# Patient Record
Sex: Female | Born: 1975 | Race: Black or African American | Hispanic: No | Marital: Married | Smoking: Never smoker
Health system: Southern US, Community
[De-identification: ages and names within clinical notes are randomized; demographics above are authoritative.]

## PROBLEM LIST (undated history)

## (undated) ENCOUNTER — Inpatient Hospital Stay (HOSPITAL_COMMUNITY): Payer: Self-pay

## (undated) DIAGNOSIS — R51 Headache: Secondary | ICD-10-CM

## (undated) DIAGNOSIS — R112 Nausea with vomiting, unspecified: Secondary | ICD-10-CM

## (undated) DIAGNOSIS — Z8669 Personal history of other diseases of the nervous system and sense organs: Secondary | ICD-10-CM

## (undated) DIAGNOSIS — J302 Other seasonal allergic rhinitis: Secondary | ICD-10-CM

## (undated) DIAGNOSIS — E0590 Thyrotoxicosis, unspecified without thyrotoxic crisis or storm: Secondary | ICD-10-CM

## (undated) DIAGNOSIS — D649 Anemia, unspecified: Secondary | ICD-10-CM

## (undated) DIAGNOSIS — Z9889 Other specified postprocedural states: Secondary | ICD-10-CM

## (undated) DIAGNOSIS — Z8619 Personal history of other infectious and parasitic diseases: Secondary | ICD-10-CM

## (undated) HISTORY — DX: Personal history of other infectious and parasitic diseases: Z86.19

## (undated) HISTORY — PX: WISDOM TOOTH EXTRACTION: SHX21

## (undated) HISTORY — DX: Thyrotoxicosis, unspecified without thyrotoxic crisis or storm: E05.90

## (undated) HISTORY — PX: OTHER SURGICAL HISTORY: SHX169

## (undated) HISTORY — DX: Personal history of other diseases of the nervous system and sense organs: Z86.69

## (undated) HISTORY — PX: MOUTH SURGERY: SHX715

---

## 2006-07-23 ENCOUNTER — Encounter: Payer: Self-pay | Admitting: Family Medicine

## 2006-08-03 ENCOUNTER — Encounter: Payer: Self-pay | Admitting: Family Medicine

## 2006-08-07 ENCOUNTER — Encounter: Payer: Self-pay | Admitting: Family Medicine

## 2006-09-11 ENCOUNTER — Encounter: Payer: Self-pay | Admitting: Family Medicine

## 2007-09-30 ENCOUNTER — Encounter: Payer: Self-pay | Admitting: Family Medicine

## 2010-04-05 ENCOUNTER — Ambulatory Visit
Admission: RE | Admit: 2010-04-05 | Discharge: 2010-04-05 | Payer: Self-pay | Source: Home / Self Care | Attending: Family Medicine | Admitting: Family Medicine

## 2010-04-05 ENCOUNTER — Encounter: Payer: Self-pay | Admitting: Family Medicine

## 2010-04-05 ENCOUNTER — Other Ambulatory Visit: Payer: Self-pay | Admitting: Family Medicine

## 2010-04-05 DIAGNOSIS — Z91018 Allergy to other foods: Secondary | ICD-10-CM | POA: Insufficient documentation

## 2010-04-05 DIAGNOSIS — J309 Allergic rhinitis, unspecified: Secondary | ICD-10-CM | POA: Insufficient documentation

## 2010-04-05 LAB — CBC WITH DIFFERENTIAL/PLATELET
Basophils Absolute: 0 10*3/uL (ref 0.0–0.1)
Basophils Relative: 0.3 % (ref 0.0–3.0)
Eosinophils Absolute: 0.1 10*3/uL (ref 0.0–0.7)
Eosinophils Relative: 2.6 % (ref 0.0–5.0)
HCT: 33.1 % — ABNORMAL LOW (ref 36.0–46.0)
Hemoglobin: 11.4 g/dL — ABNORMAL LOW (ref 12.0–15.0)
Lymphocytes Relative: 43.5 % (ref 12.0–46.0)
Lymphs Abs: 1.4 10*3/uL (ref 0.7–4.0)
MCHC: 34.3 g/dL (ref 30.0–36.0)
MCV: 89.1 fl (ref 78.0–100.0)
Monocytes Absolute: 0.3 10*3/uL (ref 0.1–1.0)
Monocytes Relative: 11.1 % (ref 3.0–12.0)
Neutro Abs: 1.3 10*3/uL — ABNORMAL LOW (ref 1.4–7.7)
Neutrophils Relative %: 42.5 % — ABNORMAL LOW (ref 43.0–77.0)
Platelets: 258 10*3/uL (ref 150.0–400.0)
RBC: 3.72 Mil/uL — ABNORMAL LOW (ref 3.87–5.11)
RDW: 14.4 % (ref 11.5–14.6)
WBC: 3.1 10*3/uL — ABNORMAL LOW (ref 4.5–10.5)

## 2010-04-05 LAB — HEPATIC FUNCTION PANEL
ALT: 14 U/L (ref 0–35)
AST: 21 U/L (ref 0–37)
Albumin: 3.9 g/dL (ref 3.5–5.2)
Alkaline Phosphatase: 45 U/L (ref 39–117)
Bilirubin, Direct: 0.1 mg/dL (ref 0.0–0.3)
Total Bilirubin: 0.7 mg/dL (ref 0.3–1.2)
Total Protein: 7.4 g/dL (ref 6.0–8.3)

## 2010-04-05 LAB — BASIC METABOLIC PANEL
BUN: 11 mg/dL (ref 6–23)
CO2: 29 mEq/L (ref 19–32)
Calcium: 9.2 mg/dL (ref 8.4–10.5)
Chloride: 108 mEq/L (ref 96–112)
Creatinine, Ser: 0.7 mg/dL (ref 0.4–1.2)
GFR: 124.4 mL/min (ref >60.00)
Glucose, Bld: 88 mg/dL (ref 70–99)
Potassium: 4.2 mEq/L (ref 3.5–5.1)
Sodium: 145 mEq/L (ref 135–145)

## 2010-04-05 LAB — LIPID PANEL
Cholesterol: 128 mg/dL (ref 0–200)
HDL: 39.9 mg/dL (ref >39.00)
LDL Cholesterol: 83 mg/dL (ref 0–99)
Total CHOL/HDL Ratio: 3
Triglycerides: 28 mg/dL (ref 0.0–149.0)
VLDL: 5.6 mg/dL (ref 0.0–40.0)

## 2010-04-05 LAB — TSH: TSH: 0.21 u[IU]/mL — ABNORMAL LOW (ref 0.35–5.50)

## 2010-04-05 LAB — T4, FREE: Free T4: 0.95 ng/dL (ref 0.60–1.60)

## 2010-04-27 ENCOUNTER — Encounter: Payer: Self-pay | Admitting: Family Medicine

## 2010-04-28 NOTE — Assessment & Plan Note (Signed)
Summary: NEW PATIENT, EST / LFW   Vital Signs:  Patient profile:   35 year old female Height:      61 inches Weight:      109.75 pounds BMI:     20.81 Temp:     98.6 degrees F oral Pulse rate:   80 / minute Pulse rhythm:   regular BP sitting:   90 / 60  (left arm) Cuff size:   regular  Vitals Entered By: Lewanda Rife LPN (April 05, 2010 11:30 AM) CC: new pt to establish   History of Present Illness: just moved to [place] from [place]  no regular doctor   no doc appts since 2009 in [place]  is generally very healthy   hx of migraines -- improved when wisdom teeth removed and then got worse again  sometimes proceed her peroid -- other times not  does not get them that often -- takes otc med - nsaids and sleeps   thyroid problem- during 2nd pregnancy-- tsh was abn -- was monitoring that  thinks she was hypothyroid  last test in 09 was ok    no other chronic problems   has had 2 cs in past - no other surgeries   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 dust  with move - more symptoms -- congestion and sinus drainge , no wheeze  tried nasal spray otc   ? got Td 2005       Preventive Screening-Counseling & Management  Alcohol-Tobacco     Smoking Status: never  Caffeine-Diet-Exercise     Does Patient Exercise: no      Drug Use:  no.    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  Married Never Smoked Alcohol use-no Drug use-no Regular exercise-no 2 kids 9, 3 - both boys   Risk Factors: Exercise: no (04/05/2010)  Risk Factors: Smoking Status: never (04/05/2010)  Past Medical History: Hx of chicken pox Hx of migraines Throid problem  Past Surgical History: Caesarean section 02/2001 and 07/2006  Family History: Mother living: arthritis Father living: alcoholism/drug problem Maternal Grandfather: arthritis, diabetes Maternal Grandmother:heart disease Maternal Great grandmother: breast cancer no other cancer   Social History: 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  no sinus tenderness  Eyes:  vision grossly intact, pupils equal, pupils round, and pupils reactive to light.  no conjunctival pallor, injection or icterus  Ears:  R ear normal and L ear normal.   Nose:  no nasal discharge.   Mouth:  pharynx pink and moist.   Neck:  prominent thyroid - nt and no thyroid bruits no LN/ no JVD or carotid bruits  Chest Wall:  No deformities, masses, or tenderness noted. Lungs:  Normal respiratory effort, chest expands symmetrically. Lungs are clear to auscultation, no crackles or wheezes. Heart:  Normal rate and regular rhythm. S1 and S2 normal without  gallop, murmur, click, rub or other extra sounds. Abdomen:  Bowel sounds positive,abdomen soft and non-tender without masses, organomegaly or hernias noted. no renal bruits  Msk:  No deformity or scoliosis noted of thoracic or lumbar spine.   Pulses:  R and L carotid,radial,femoral,dorsalis pedis and posterior tibial pulses are full and equal bilaterally Extremities:  No clubbing, cyanosis, edema, or deformity noted with normal full range of motion of all joints.   Neurologic:  sensation intact to light touch, gait normal, and DTRs symmetrical and normal.   Skin:  Intact without suspicious lesions or rashes Cervical Nodes:  No lymphadenopathy noted Inguinal Nodes:  No significant adenopathy Psych:  normal affect, talkative and pleasant    Impression & Recommendations:  Problem # 1:  HYPOTHYROIDISM (ICD-244.9) Assessment New in past with preg now has fatigue check tsh and free T4 prominent thyroid on exam Orders: Venipuncture (16109) TLB-Lipid Panel (80061-LIPID) TLB-BMP (Basic Metabolic Panel-BMET) (80048-METABOL) TLB-CBC Platelet - w/Differential (85025-CBCD) TLB-Hepatic/Liver Function Pnl (80076-HEPATIC) TLB-TSH (Thyroid Stimulating Hormone) (84443-TSH) TLB-T4 (Thyrox), Free (60454-UJ8J)  Problem # 2:  ALLERGIC RHINITIS (ICD-477.9) Assessment: New  trial of flonase for congestion and rhinorrhea  will update    Her updated medication list for this problem includes:    Flonase 50 Mcg/act Susp (Fluticasone propionate) .Marland Kitchen... 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 Otc as directed. 2)  Multivitamins Tabs (Multiple vitamin) .... Take 1 tablet by mouth once a day 3)  Epipen 2-pak 0.3 Mg/0.40ml Devi (Epinephrine) .... Inject times one as needed nut allergy 4)  Flonase 50 Mcg/act Susp (Fluticasone propionate) .... 2 sprays in each nostril once daily  Patient Instructions: 1)  have epi pen on hand for nut reaction  2)  use flonase daily  3)  labs today 4)  schedule PE in next 4-6 weeks please (any 30 min appt)  Prescriptions: FLONASE 50 MCG/ACT SUSP (FLUTICASONE PROPIONATE) 2 sprays in each nostril once daily  #1 mdi x 11   Entered and Authorized by:   Judith Part MD   Signed by:   Judith Part MD on 04/05/2010   Method used:   Electronically to        [id number] (ret[facility])       [address]       [place], [place]  [address]       Ph: [phone]  Fax: [phone]   RxID:   [id number] EPIPEN 2-PAK 0.3 MG/0.3ML DEVI (EPINEPHRINE) inject times one as needed nut allergy  #1 x 11   Entered and Authorized by:   Judith Part MD   Signed by:   Judith Part MD on 04/05/2010   Method used:   Electronically to        [id number] (retail)       [address]       [place], [place]  [address]       Ph: [phone]       Fax: [phone]   RxID:   [id number]    Orders Added: 1)  Venipuncture [55732] 2)  TLB-Lipid Panel [80061-LIPID] 3)  TLB-BMP (Basic Metabolic Panel-BMET) [80048-METABOL] 4)  TLB-CBC Platelet - w/Differential [85025-CBCD] 5)  TLB-Hepatic/Liver Function Pnl [80076-HEPATIC] 6)  TLB-TSH (Thyroid Stimulating Hormone) [84443-TSH] 7)  TLB-T4 (Thyrox), Free [20254-YH0W] 8)  Dermatology Referral [Derma] 9)  Prescription Created Electronically [phone] 10)  New Patient Level III [83151]    Current Allergies (reviewed today): No known allergies

## 2010-04-28 NOTE — Letter (Signed)
Summary: Patient Questionnaire  Patient Questionnaire   Imported By: Beau Fanny 04/06/2010 08:49:50  _____________________________________________________________________  External Attachment:    Type:   Image     Comment:   External Document

## 2010-05-04 NOTE — Op Note (Signed)
Summary: Low Transverse Cesarean Section/[facility]   Low Transverse Cesarean Section/[facility]   Imported By: Maryln Gottron 04/29/2010 15:37:42  _____________________________________________________________________  External Attachment:    Type:   Image     Comment:   External Document

## 2010-05-04 NOTE — Letter (Signed)
[facility]: [facility]   Imported By: Maryln Gottron 04/29/2010 15:30:02  _____________________________________________________________________  External Attachment:    Type:   Image     Comment:   External Document

## 2010-05-04 NOTE — Letter (Signed)
Summary: [facility]   Imported By: Maryln Gottron 04/29/2010 15:28:43  _____________________________________________________________________  External Attachment:    Type:   Image     Comment:   External Document

## 2010-05-04 NOTE — Letter (Signed)
Summary: [facility]  [facility]   Imported By: Maryln Gottron 04/29/2010 15:31:28  _____________________________________________________________________  External Attachment:    Type:   Image     Comment:   External Document

## 2010-05-04 NOTE — Letter (Signed)
Summary: [facility]  [facility]   Imported By: Maryln Gottron 04/29/2010 15:32:57  _____________________________________________________________________  External Attachment:    Type:   Image     Comment:   External Document

## 2010-05-12 NOTE — Letter (Signed)
Summary: [facility] -Endocrinology   [facility] -Endocrinology   Imported By: Kassie Mends 05/03/2010 11:54:17  _____________________________________________________________________  External Attachment:    Type:   Image     Comment:   External Document  Appended Document: [facility] -Endocrinology     Clinical Lists Changes  Observations: Added new observation of PAST MED HX: Hx of chicken pox Hx of migraines subclinical hyperthyroidism      endo- [facility] (05/05/2010 21:40)       Past Medical History:    Hx of chicken pox    Hx of migraines    subclinical hyperthyroidism                     [facility]

## 2010-05-16 ENCOUNTER — Other Ambulatory Visit (HOSPITAL_COMMUNITY)
Admission: RE | Admit: 2010-05-16 | Discharge: 2010-05-16 | Disposition: A | Discharge status: Home / Self Care | Payer: BC Managed Care – PPO | Source: Ambulatory Visit | Attending: Family Medicine | Admitting: Family Medicine

## 2010-05-16 ENCOUNTER — Other Ambulatory Visit: Payer: Self-pay | Admitting: Family Medicine

## 2010-05-16 ENCOUNTER — Encounter: Payer: Self-pay | Admitting: Family Medicine

## 2010-05-16 ENCOUNTER — Encounter (INDEPENDENT_AMBULATORY_CARE_PROVIDER_SITE_OTHER): Payer: BC Managed Care – PPO | Admitting: Family Medicine

## 2010-05-16 DIAGNOSIS — E0590 Thyrotoxicosis, unspecified without thyrotoxic crisis or storm: Secondary | ICD-10-CM

## 2010-05-16 DIAGNOSIS — Z01419 Encounter for gynecological examination (general) (routine) without abnormal findings: Secondary | ICD-10-CM

## 2010-05-16 DIAGNOSIS — R87810 Cervical high risk human papillomavirus (HPV) DNA test positive: Secondary | ICD-10-CM | POA: Insufficient documentation

## 2010-05-16 DIAGNOSIS — Z0000 Encounter for general adult medical examination without abnormal findings: Secondary | ICD-10-CM

## 2010-05-16 DIAGNOSIS — D649 Anemia, unspecified: Secondary | ICD-10-CM

## 2010-05-16 DIAGNOSIS — Z124 Encounter for screening for malignant neoplasm of cervix: Secondary | ICD-10-CM | POA: Insufficient documentation

## 2010-05-17 ENCOUNTER — Ambulatory Visit: Payer: Self-pay

## 2010-05-24 ENCOUNTER — Telehealth: Payer: Self-pay | Admitting: Family Medicine

## 2010-05-24 DIAGNOSIS — R87810 Cervical high risk human papillomavirus (HPV) DNA test positive: Secondary | ICD-10-CM | POA: Insufficient documentation

## 2010-05-24 NOTE — Assessment & Plan Note (Signed)
Summary: 4-6 WEEK PE 30 MIN/[facility]H   Vital Signs:  Patient profile:   35 year old female Weight:      107.25 pounds BMI:     20.34 Temp:     98.1 degrees F oral Pulse rate:   64 / minute Pulse rhythm:   regular BP sitting:   98 / 58  (left arm) Cuff size:   regular  Vitals Entered By: Sydell Axon LPN (May 16, 2010 9:24 AM) CC: 30 Minute checkup   History of Present Illness: here for wellness is feeling fine -- feeling a little more energetic  started exercising   wt is down 2 lb  bp 98/58  tsh was low- ref to endo ruled out grave's dz  gets uptake and scan tomorrow     lipids good Last Lipid ProfileCholesterol: 128 (04/05/2010 12:03:23 PM)HDL:  39.90 (04/05/2010 12:03:23 PM)LDL:  83 (04/05/2010 12:03:23 PM)Triglycerides:  Last Liver profileSGOT:  21 (04/05/2010 12:03:23 PM)SPGT:  14 (04/05/2010 12:03:23 PM)T. Bili:  0.7 (04/05/2010 12:03:23 PM)Alk Phos:  45 (04/05/2010 12:03:23 PM)    a little anemic with 11.4 hb has always been anemic just a little bit  peroids are medium flow - lasting about 5 days or so  no [place] trait / or thalassemia know  very balanced diet most day     pap-- needs to do that today  no abn paps -  no problems   td -- 2006 she thinks  thinks the flonase is helping some  Allergies: No Known Drug Allergies  Past History:  Past Medical History: Last updated: 05/05/2010 Hx of chicken pox Hx of migraines subclinical hyperthyroidism      [facility]  Past Surgical History: Last updated: 04/05/2010 Caesarean section 02/2001 and 07/2006  Family History: Last updated: 04/05/2010 Mother living: arthritis Father living: alcoholism/drug problem Maternal Grandfather: arthritis, diabetes Maternal Grandmother:heart disease Maternal Great grandmother: breast cancer no other cancer   Social History: Last updated: 04/05/2010 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 Eyes:  Denies blurring and eye irritation. ENT:  Complains of nasal congestion; denies sinus pressure. CV:  Denies chest pain or discomfort, lightheadness, palpitations, and shortness of breath with exertion. Resp:  Denies cough, shortness of breath, and wheezing. GI:  Denies abdominal pain, change in bowel habits, indigestion, and nausea. GU:  Denies dysuria and urinary frequency. MS:  Denies joint pain, joint redness, joint swelling, and cramps. Derm:  Denies itching, lesion(s), poor wound healing, and rash. Neuro:  Denies numbness and tingling. Psych:  Denies anxiety and depression. Endo:  Denies cold intolerance, excessive thirst, excessive urination, and heat intolerance. Heme:  Denies abnormal bruising and bleeding.  Physical Exam  General:  Well-developed,well-nourished,in no acute distress; alert,appropriate and cooperative throughout examination Head:  normocephalic, atraumatic, and no abnormalities observed.   Eyes:  vision grossly intact, pupils equal, pupils round, and pupils reactive to light.  no conjunctival pallor, injection or icterus  Ears:  R ear normal and L ear normal.   Nose:  no nasal discharge.  - nares are injected Mouth:  pharynx pink and moist.   Neck:  prominent thyroid - nt and no thyroid  bruits no LN/ no JVD or carotid bruits  Chest Wall:  No deformities, masses, or tenderness noted. Breasts:  No mass, nodules, thickening, tenderness, bulging, retraction, inflamation, nipple discharge or skin changes noted.   Lungs:  Normal respiratory effort, chest expands symmetrically. Lungs are clear to auscultation, no crackles or wheezes. Heart:  Normal rate and  regular rhythm. S1 and S2 normal without gallop, murmur, click, rub or other extra sounds. Abdomen:  Bowel sounds positive,abdomen soft and non-tender without masses, organomegaly or hernias noted. no renal bruits  Genitalia:  Normal introitus for age, no external lesions, no vaginal discharge, mucosa pink and moist, no vaginal or cervical lesions, no vaginal atrophy, no friaility or hemorrhage, normal uterus size and position, no adnexal masses or tenderness Msk:  No deformity or scoliosis noted of thoracic or lumbar spine.   Pulses:  R and L carotid,radial,femoral,dorsalis pedis and posterior tibial pulses are full and equal bilaterally Extremities:  No clubbing, cyanosis, edema, or deformity noted with normal full range of motion of all joints.   Neurologic:  sensation intact to light touch, gait normal, and DTRs symmetrical and normal.   Skin:  Intact without suspicious lesions or rashes Cervical Nodes:  No lymphadenopathy noted Axillary Nodes:  No palpable lymphadenopathy Inguinal Nodes:  No significant adenopathy Psych:  normal affect, talkative and pleasant    Impression & Recommendations:  Problem # 1:  HEALTH MAINTENANCE EXAM (ICD-V70.0) Assessment Comment Only reviewed health habits including diet, exercise and skin cancer prevention reviewed health maintenance list and family history rev labs with pt  good cholesterol   Problem # 2:  HYPERTHYROIDISM, SUBCLINICAL (ICD-242.90) Assessment: New will follow with endo for scan and uptake   Problem # 3:  ANEMIA, 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 Otc as  directed. 2)  Multivitamins Tabs (Multiple vitamin) .... Take 1 tablet by mouth once a day 3)  Epipen 2-pak 0.3 Mg/0.84ml Devi (Epinephrine) .... Inject times one as needed nut allergy 4)  Flonase 50 Mcg/act Susp (Fluticasone propionate) .... 2 sprays in each nostril once daily  Patient Instructions: 1)  try to take a multivitamin with iron every day for mild anemia  2)  if your insurance covers a baseline screening mammogram at 34- let me know and I will schedule it    Orders Added: 1)  Est. Patient 18-39 years [99395]    Current Allergies (reviewed today): No known allergies    Preventive Care Screening  Last Tetanus Booster:    Date:  03/27/2004    Results:  Td

## 2010-05-27 ENCOUNTER — Encounter: Payer: Self-pay | Admitting: Family Medicine

## 2010-06-02 NOTE — Progress Notes (Signed)
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  I tried to reassure her that hpv is easily controlled with pap smears, most women will have it at one time or another and that most women clear the virus, but she wanted me to check with you.               Lowella Petties CMA, AAMA  May 24, 2010 3:13 PM   Follow-up for Phone Call        it is a virus that can increase the risk of cervical changes  most people clear the virus themselves so the recommendation is to continue yearly paps and hpv tests  if a pap came back abnormal we would refer to a gyn for a cervical biopsy  the hpv is found via a lab test  if she feels uncomfortable with this and would like to see a gyn I am happy to refer at any time   Follow-up by: Judith Part MD,  May 24, 2010 3:44 PM  Additional Follow-up for Phone Call Additional follow up Details #1::        Patient notified as instructed by telephone. Pt would like to go ahead and have GYN referral now Pt said she wants a GYN that is a good educator and is not in a hurry (someone like Dr. Milinda Antis pt said).Pt can [facility] to [facility] or [facility] for appt and will wait to hear from pt care coordinator about appt. Pt can be reached at [phone] or [phone].Lewanda Rife LPN  May 24, 2010 5:37 PM   New Problems: CERV HIGH RISK HUMAN PAPILLOMAVIRUS DNA TEST POS (ICD-795.05)   Additional Follow-up for Phone Call Additional follow up Details #2::    I will go ahead and do referral to GYN I like [facility] but that would depend on insurance   Follow-up by: Judith Part MD,  May 24, 2010 5:51 PM  Additional Follow-up for Phone Call Additional follow up Details #3::  Details for Additional Follow-up Action Taken: Appt made with Dr Vincente Poli on 06/15/2010 at 9am. Additional Follow-up by: Carlton Adam,  May 25, 2010 9:52 AM  New Problems: CERV HIGH RISK HUMAN PAPILLOMAVIRUS DNA TEST POS (ICD-795.05)

## 2010-06-14 NOTE — Letter (Signed)
Summary: [facility]  [facility]   Imported By: Maryln Gottron 06/02/2010 13:55:56  _____________________________________________________________________  External Attachment:    Type:   Image     Comment:   External Document

## 2010-06-23 ENCOUNTER — Encounter: Payer: Self-pay | Admitting: Family Medicine

## 2010-10-11 ENCOUNTER — Telehealth: Payer: Self-pay | Admitting: *Deleted

## 2010-10-11 NOTE — Telephone Encounter (Signed)
Patient dropped off cpx form to be filled out. Form is on your shelf. If patient needs an appt before this can be filled out, please send this back to me and I will call her to schedule it. Her cpx was done on 05-18-10.

## 2010-10-11 NOTE — Telephone Encounter (Signed)
This form requires vision and hearing screen (gross) and ua  These were not done at her last visit She needs to come in for a visit - will be quick just to do those things and I will talk to her briefly  Thanks  Will put form back in IN box

## 2010-10-12 NOTE — Telephone Encounter (Signed)
Left message for patient to call and schedule appt

## 2010-10-12 NOTE — Telephone Encounter (Signed)
Patient called back, scheduled appt for Monday.

## 2010-10-18 ENCOUNTER — Encounter: Payer: Self-pay | Admitting: Family Medicine

## 2010-10-19 ENCOUNTER — Ambulatory Visit (INDEPENDENT_AMBULATORY_CARE_PROVIDER_SITE_OTHER): Payer: BC Managed Care – PPO | Admitting: Family Medicine

## 2010-10-19 ENCOUNTER — Encounter: Payer: Self-pay | Admitting: Family Medicine

## 2010-10-19 VITALS — BP 88/60 | HR 76 | Temp 98.7°F | Ht 60.75 in | Wt 112.8 lb

## 2010-10-19 DIAGNOSIS — Z020 Encounter for examination for admission to educational institution: Secondary | ICD-10-CM | POA: Insufficient documentation

## 2010-10-19 DIAGNOSIS — Z0289 Encounter for other administrative examinations: Secondary | ICD-10-CM

## 2010-10-19 DIAGNOSIS — Z0000 Encounter for general adult medical examination without abnormal findings: Secondary | ICD-10-CM

## 2010-10-19 NOTE — Progress Notes (Signed)
Subjective:    Patient ID: Abigail Kemp, female    DOB: 06/21/1975, 35 y.o.   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 ALLERGIC RHINITIS  . PERSONAL HISTORY OF ALLERGY TO OTHER FOODS  . HYPERTHYROIDISM, SUBCLINICAL  . ANEMIA, MILD  . CERV HIGH RISK HUMAN PAPILLOMAVIRUS DNA TEST POS  . School health examination   Past Medical History  Diagnosis Date  . History of chicken pox   . Hx of migraines   . Subclinical hyperthyroidism    Past Surgical History  Procedure Date  . Caesarean section  02-2001 and 07-2006   History  Substance Use Topics  . Smoking status: Never Smoker   . Smokeless tobacco: Not on file  . Alcohol Use: No   Family History  Problem Relation Age of Onset  . Arthritis Mother   . Alcohol abuse Father   . Drug abuse Father   . Heart disease Maternal Grandmother   . Arthritis Maternal Grandfather   . Diabetes Maternal Grandfather   . Cancer Neg Hx    No Known Allergies Current Outpatient Prescriptions on File Prior to Visit  Medication Sig Dispense Refill  . fluticasone (FLONASE) 50 MCG/ACT nasal spray Place 2 sprays into the nose daily.        . multivitamin (THERAGRAN) per tablet Take 1 tablet by mouth daily.        Marland Kitchen acetaminophen (TYLENOL) 500 MG tablet Take 500 mg by mouth as directed.        Marland Kitchen EPINEPHrine (EPIPEN 2-PAK) 0.3 mg/0.3 mL DEVI Inject 0.3 mg into the muscle once. Inject times one as needed for nut allergy          Review of Systems Review of Systems  Constitutional: Negative for fever, appetite change, fatigue and unexpected weight change.  Eyes:  Negative for pain and visual disturbance.  Respiratory: Negative for cough and shortness of breath.   Cardiovascular: Negative.  for cp or palpitations Gastrointestinal: Negative for nausea, diarrhea and constipation.  Genitourinary: Negative for urgency and frequency.  Skin: Negative for pallor. or rash Neurological: Negative for weakness, light-headedness, numbness and headaches.  Hematological: Negative for adenopathy. Does not bruise/bleed easily.  Psychiatric/Behavioral: Negative for dysphoric mood. The patient is not nervous/anxious.          Objective:   Physical Exam  Constitutional: She appears well-developed and well-nourished. No distress.  HENT:  Head: Normocephalic and atraumatic.  Right Ear: External ear normal.  Left Ear: External ear normal.  Nose: Nose normal.  Mouth/Throat: Oropharynx is clear and moist.  Eyes: Conjunctivae and EOM are normal. Pupils are equal, round, and reactive to light.  Neck: Normal range of motion. Neck supple. No JVD present. Carotid bruit is not present. No thyromegaly present.  Cardiovascular: Normal rate, regular rhythm, normal heart sounds and intact distal pulses.   Pulmonary/Chest: Effort normal and breath sounds normal. No respiratory distress. She has no wheezes.  Abdominal: Soft. Bowel sounds are normal. She exhibits no distension and no mass. There is no tenderness.  Musculoskeletal:  Normal range of motion. She exhibits no edema and no tenderness.  Lymphadenopathy:    She has no cervical adenopathy.  Neurological: She has normal reflexes. No cranial nerve deficit. Coordination normal.  Skin: Skin is warm and dry. No rash noted. No erythema. No pallor.  Psychiatric: She has a normal mood and affect.          Assessment & Plan:

## 2010-10-19 NOTE — Patient Instructions (Signed)
Please get your immunization records and bring them back/ drop them off with your form  No restrictions for school Good luck with everything  Please have the ladies copy your form on the way out

## 2010-10-19 NOTE — Assessment & Plan Note (Signed)
No restrictions for beginning doctorate program at [employer] Since not in health sciences does not need PPD Pt signed waiver to skip meningococcal vaccine Td up to date Need rest of imms Exam form filled out  Pt will bring imms and then will finish forms

## 2011-05-22 ENCOUNTER — Ambulatory Visit (INDEPENDENT_AMBULATORY_CARE_PROVIDER_SITE_OTHER): Payer: BC Managed Care – PPO | Admitting: Family Medicine

## 2011-05-22 ENCOUNTER — Encounter: Payer: Self-pay | Admitting: Family Medicine

## 2011-05-22 VITALS — BP 100/60 | HR 88 | Temp 98.6°F | Ht 60.75 in | Wt 116.5 lb

## 2011-05-22 DIAGNOSIS — Z331 Pregnant state, incidental: Secondary | ICD-10-CM

## 2011-05-22 DIAGNOSIS — K429 Umbilical hernia without obstruction or gangrene: Secondary | ICD-10-CM

## 2011-05-22 DIAGNOSIS — Z3201 Encounter for pregnancy test, result positive: Secondary | ICD-10-CM

## 2011-05-22 NOTE — Assessment & Plan Note (Signed)
Small - for the most part asymptomatic ? What will need to be done for pregnancy Pt will have her gyn eval this  Adv if pain or problems to update

## 2011-05-22 NOTE — Patient Instructions (Addendum)
Check with pharmacy about chewable prenatal vitamin or see if there is one over the counter  If not, just ask about the best brand and call and I will px it Glad you are feeling good  Keep well hydrated  We will refer you to OBGYN at check out (for pregnancy and also ask her about hernia)

## 2011-05-22 NOTE — Progress Notes (Signed)
Subjective:    Patient ID: Abigail Kemp, female    DOB: 10/08/1975, 36 y.o.   MRN: [id number]  HPI Here with new pregnancy -- LMP was 5th of January- and was fairly light and short  Is feeling fair- very tired and bad taste in her mouth all the time She goes to Dr Vincente Poli -- has not called them to make an appt yet   Has 2 children already- they are 10, 41/2   A little nausea - no vomiting , but has dry heaves  Has not started PNV yet  Will get otc  Is drinking enough water  Sticking with bland diet- and craving spinach   Has a hernia that is bothering her  Before the holidays -- had a little pain occasionally Is sticking out - umbilical  Did bother her a bit during 2nd pregnancy    No other symptoms   Patient Active Problem List  Diagnoses  . ALLERGIC RHINITIS  . PERSONAL HISTORY OF ALLERGY TO OTHER FOODS  . HYPERTHYROIDISM, SUBCLINICAL  . ANEMIA, MILD  . CERV HIGH RISK HUMAN PAPILLOMAVIRUS DNA TEST POS  . School health examination  . Pregnancy examination or test, positive result  . Hernia, umbilical   Past Medical History  Diagnosis Date  . History of chicken pox   . Hx of migraines   . Subclinical hyperthyroidism    Past Surgical History  Procedure Date  . Caesarean section  02-2001 and 07-2006   History  Substance Use Topics  . Smoking status: Never Smoker   . Smokeless tobacco: Not on file  . Alcohol Use: No   Family History  Problem Relation Age of Onset  . Arthritis Mother   . Alcohol abuse Father   . Drug abuse Father   . Heart disease Maternal Grandmother   . Arthritis Maternal Grandfather   . Diabetes Maternal Grandfather   . Cancer Neg Hx    No Known Allergies Current Outpatient Prescriptions on File Prior to Visit  Medication Sig Dispense Refill  . acetaminophen (TYLENOL) 500 MG tablet Take 500 mg by mouth as directed.        Marland Kitchen EPINEPHrine (EPIPEN 2-PAK) 0.3 mg/0.3 mL DEVI Inject 0.3 mg into the muscle once. Inject times one as needed  for nut allergy           Review of Systems Review of Systems  Constitutional: Negative for fever, appetite change, and unexpected weight change.  Eyes: Negative for pain and visual disturbance.  Respiratory: Negative for cough and shortness of breath.   Cardiovascular: Negative for cp or palpitations    Gastrointestinal: Negative for diarrhea and constipation. neg for vomiting  Genitourinary: Negative for urgency and frequency.  Skin: Negative for pallor or rash   Neurological: Negative for weakness, light-headedness, numbness and headaches.  Hematological: Negative for adenopathy. Does not bruise/bleed easily.  Psychiatric/Behavioral: Negative for dysphoric mood. The patient is not nervous/anxious.          Objective:   Physical Exam  Constitutional: She appears well-developed and well-nourished. No distress.  HENT:  Head: Normocephalic and atraumatic.  Mouth/Throat: Oropharynx is clear and moist.  Eyes: Conjunctivae and EOM are normal. Pupils are equal, round, and reactive to light. No scleral icterus.  Neck: Normal range of motion. Neck supple. No JVD present. No thyromegaly present.  Cardiovascular: Normal rate, regular rhythm, normal heart sounds and intact distal pulses.  Exam reveals no gallop.   Pulmonary/Chest: Effort normal and breath sounds normal. No respiratory distress. She  has no wheezes.  Abdominal: Soft. Bowel sounds are normal. She exhibits no distension and no mass. There is no tenderness.       No suprapubic tenderness    Small 1-2 cm umbilical hernia noted that is reducible and nontender   Musculoskeletal: She exhibits no edema.  Lymphadenopathy:    She has no cervical adenopathy.  Neurological: She is alert. She has normal reflexes. She exhibits normal muscle tone. Coordination normal.  Skin: Skin is warm and dry. No rash noted. No erythema. No pallor.  Psychiatric: She has a normal mood and affect.          Assessment & Plan:

## 2011-05-22 NOTE — Assessment & Plan Note (Signed)
With mild nausea- feeling ok  Will call if she needs px for chewable PNV Disc avoidance of alcohol / otc meds Will stop flonase  Ref to OBGYN Per LMP - is 7 2/7 weeks - but last menses was light - may need [place] for dates

## 2011-05-23 ENCOUNTER — Ambulatory Visit: Payer: BC Managed Care – PPO | Admitting: Family Medicine

## 2011-06-05 LAB — OB RESULTS CONSOLE HIV ANTIBODY (ROUTINE TESTING): HIV: NONREACTIVE

## 2011-06-05 LAB — OB RESULTS CONSOLE RPR: RPR: NONREACTIVE

## 2011-06-05 LAB — OB RESULTS CONSOLE ABO/RH

## 2011-06-05 LAB — OB RESULTS CONSOLE ANTIBODY SCREEN: Antibody Screen: NEGATIVE

## 2011-10-17 ENCOUNTER — Other Ambulatory Visit: Payer: Self-pay | Admitting: Family Medicine

## 2011-10-17 NOTE — Telephone Encounter (Signed)
Will refill electronically  

## 2011-10-17 NOTE — Telephone Encounter (Signed)
Received refill request electronically from pharmacy. Medication is no longer on med sheet. Is it okay to refill medication? 

## 2011-12-21 ENCOUNTER — Encounter (HOSPITAL_COMMUNITY): Payer: Self-pay | Admitting: Pharmacist

## 2011-12-26 ENCOUNTER — Encounter (HOSPITAL_COMMUNITY): Payer: Self-pay | Admitting: *Deleted

## 2011-12-26 ENCOUNTER — Inpatient Hospital Stay (HOSPITAL_COMMUNITY): Payer: BC Managed Care – PPO

## 2011-12-26 ENCOUNTER — Inpatient Hospital Stay (HOSPITAL_COMMUNITY)
Admission: AD | Admit: 2011-12-26 | Discharge: 2011-12-26 | Disposition: A | Discharge status: Home / Self Care | Payer: BC Managed Care – PPO | Source: Ambulatory Visit | Attending: Obstetrics and Gynecology | Admitting: Obstetrics and Gynecology

## 2011-12-26 DIAGNOSIS — O99891 Other specified diseases and conditions complicating pregnancy: Secondary | ICD-10-CM | POA: Insufficient documentation

## 2011-12-26 DIAGNOSIS — O479 False labor, unspecified: Secondary | ICD-10-CM

## 2011-12-26 DIAGNOSIS — O26859 Spotting complicating pregnancy, unspecified trimester: Secondary | ICD-10-CM | POA: Insufficient documentation

## 2011-12-26 NOTE — Progress Notes (Signed)
NP did spec exam and no pooling or evidence of SROM.

## 2011-12-26 NOTE — MAU Note (Signed)
PT SAYS SHE IS [facility] C/S - REPEAT- ON 01-08-2012.  NOTICED BLOODY SHOW AT 5 PM.    FEELS SOME UC.   UNSURE IF SROM- CALLED ON CALL NURSE.  HAD APPOINTMENT  TODAY-  VE - CLOSED.  DENIES HSV AND MRSA.

## 2011-12-26 NOTE — MAU Provider Note (Signed)
History     CSN: [id number]  Arrival date and time: 12/26/11 1919   None     No chief complaint on file.  HPI Abigail Kemp 36 y.o. [redacted]w[redacted]d Comes to MAU with spotting and thinking her water has broken.  Previous C/S with scheduled date of 01-08-12.  Was in the office today and had cervical exam.  Has been seeing blood and mucus when wiping.  With a previous pregnancy has only a small leak of fluid with ROM so is suspecting ROM today.  Also has not ever had bleeding before so is concerned.  Having contractions but not having pain.  OB History    Grav Para Term Preterm Abortions TAB SAB Ect Mult Living   4 3 3       2       Past Medical History  Diagnosis Date  . History of chicken pox   . Hx of migraines   . Subclinical hyperthyroidism     Past Surgical History  Procedure Date  . Caesarean section  02-2001 and 07-2006  . Cesarean section   . Mouth surgery     wisdom teeth    Family History  Problem Relation Age of Onset  . Arthritis Mother   . Alcohol abuse Father   . Drug abuse Father   . Heart disease Maternal Grandmother   . Arthritis Maternal Grandfather   . Diabetes Maternal Grandfather   . Cancer Neg Hx     History  Substance Use Topics  . Smoking status: Never Smoker   . Smokeless tobacco: Not on file  . Alcohol Use: No    Allergies:  Allergies  Allergen Reactions  . Other Anaphylaxis    All nuts except peanuts    Prescriptions prior to admission  Medication Sig Dispense Refill  . Fe Cbn-Fe Gluc-FA-B12-C-DSS (FERRALET 90) 90-1 MG TABS Take 1 tablet by mouth daily.      . fluticasone (FLONASE) 50 MCG/ACT nasal spray Place 2 sprays into the nose daily as needed. For allergies      . methimazole (TAPAZOLE) 5 MG tablet Take 5 mg by mouth daily.      . Prenatal Vit-Fe Fumarate-FA (MULTIVITAMIN-PRENATAL) 27-0.8 MG TABS Take 1 tablet by mouth daily.      Marland Kitchen EPINEPHrine (EPIPEN 2-PAK) 0.3 mg/0.3 mL DEVI Inject 0.3 mg into the muscle once. Inject times  one as needed for nut allergy         Review of Systems  Gastrointestinal: Negative for abdominal pain.  Genitourinary: Negative for dysuria.       Vagina bleeding   Physical Exam   Blood pressure 117/61, pulse 86, temperature 98.1 F (36.7 C), temperature source Oral, resp. rate 20, height 5' 0.5" (1.537 m), weight 71.782 kg (158 lb 4 oz), last menstrual period 04/01/2011.  Physical Exam  Nursing note and vitals reviewed. Constitutional: She is oriented to person, place, and time. She appears well-developed and well-nourished.  HENT:  Head: Normocephalic.  Eyes: EOM are normal.  Neck: Neck supple.  GI: Soft. There is no tenderness.       Fetal monitor shows contractions 3-6 minutes, but client is in no discomfort.  Client sitting in bed - had decel lasting 3 minutes with contraction.  Also had 2 more decels.  Position change to left lateral side - no further decels noted.  Will get BPP and AFI.  Genitourinary:       Speculum exam - no pooling with valsalva, minimal discharge, dark blood with  mucus seen coming from the cervical os - small amount.  Musculoskeletal: Normal range of motion.  Neurological: She is alert and oriented to person, place, and time.  Skin: Skin is warm and dry.  Psychiatric: She has a normal mood and affect.    MAU Course  Procedures Clinical Data: Vaginal spotting  LIMITED OBSTETRIC ULTRASOUND  Number of Fetuses: 1  Heart Rate: 140 bpm  Movement: Identified  Presentation: Cephalic  Placental Location: Fundal  Previa: Not identified  Amniotic Fluid (Subjective): Within normal limits  AFI: 18.6 cm (5%ile 7.3 cm, 95%ile 23.9 cm)  BPD: 10cm 41w 2d  MATERNAL FINDINGS:  Cervix: Not evaluate  Uterus/Adnexae: Within normal limits. Ovaries not visualized.  Biophysical profile: Time elapsed 5 minutes. Total score 8/8, with  normal movement, breathing, tone, and amniotic fluid.  IMPRESSION:  Biophysical profile 8/8.  No placental previa  identified.  MDM Likely decels seen were related to maternal position.  Decels did not continue with position change.  BPP 8/8 Monitor strip FHT baseline 150.  Moderate variability  - reactive  Assessment and Plan  [redacted]w[redacted]d pregnancy, previous C/S, evaluated for ROM but no ROM found  Plan Will discharge Follow up with appointments as scheduled in the office. Call your doctor if the bleeding worsens.    Abigail Kemp 12/26/2011, 9:36 PM

## 2011-12-26 NOTE — MAU Provider Note (Deleted)
Shandee Jergens 36 y.o. [redacted]w[redacted]d Previous C/S and scheduled for C/S on 01-08-12.  Was in the office earlier today and had vaginal exam.  Comes to MAU with spotting and questioning whether membranes have  Ruptured as she is seeing blood and mucus when she wipes.  Speculum exam to R/O ROM.  Vagina - no pooling.  Cervix has very small amount of mucus and dark blood coming from os.  No leaking with valsalva.  Minimal discharge noted.  Bimanual exam - cervix closed, thick and soft.  Pink blood noted on glove.  Clinically  - no ROM.   Fetal monitor - baseline 150.  Contractions every 3-6 minutes - not painful.  Decel to 120 noted with contraction lasting 3 minutes.  Will get BPP and AFI to confirm appropriate fluid and fetal wellbeing given bloody discharge and client perception of possible ROM.  States she has never had bleeding previously with any pregnancy.

## 2011-12-26 NOTE — Progress Notes (Signed)
Lilyan Punt NP in earlier and u/s results discussed along with d/c plan. Written and verbal d/c instructions given and understanding voiced.

## 2011-12-29 ENCOUNTER — Encounter (HOSPITAL_COMMUNITY): Payer: Self-pay

## 2012-01-01 ENCOUNTER — Encounter (HOSPITAL_COMMUNITY): Payer: Self-pay

## 2012-01-01 ENCOUNTER — Encounter (HOSPITAL_COMMUNITY)
Admission: RE | Admit: 2012-01-01 | Discharge: 2012-01-01 | Disposition: A | Discharge status: Home / Self Care | Payer: BC Managed Care – PPO | Source: Ambulatory Visit | Attending: Obstetrics and Gynecology | Admitting: Obstetrics and Gynecology

## 2012-01-01 HISTORY — DX: Anemia, unspecified: D64.9

## 2012-01-01 HISTORY — DX: Headache: R51

## 2012-01-01 HISTORY — DX: Other seasonal allergic rhinitis: J30.2

## 2012-01-01 HISTORY — DX: Other specified postprocedural states: Z98.890

## 2012-01-01 HISTORY — DX: Nausea with vomiting, unspecified: R11.2

## 2012-01-01 LAB — ABO/RH: ABO/RH(D): O POS

## 2012-01-01 LAB — TYPE AND SCREEN
ABO/RH(D): O POS
Antibody Screen: NEGATIVE

## 2012-01-01 LAB — CBC
HCT: 34.2 % — ABNORMAL LOW (ref 36.0–46.0)
Hemoglobin: 11.5 g/dL — ABNORMAL LOW (ref 12.0–15.0)
MCH: 30.5 pg (ref 26.0–34.0)
MCHC: 33.6 g/dL (ref 30.0–36.0)
MCV: 90.7 fL (ref 78.0–100.0)
Platelets: 185 10*3/uL (ref 150–400)
RBC: 3.77 MIL/uL — ABNORMAL LOW (ref 3.87–5.11)
RDW: 14.1 % (ref 11.5–15.5)
WBC: 9.2 10*3/uL (ref 4.0–10.5)

## 2012-01-01 LAB — SURGICAL PCR SCREEN
MRSA, PCR: NEGATIVE
Staphylococcus aureus: NEGATIVE

## 2012-01-01 LAB — SYPHILIS: RPR W/REFLEX TO RPR TITER AND TREPONEMAL ANTIBODIES, TRADITIONAL SCREENING AND DIAGNOSIS ALGORITHM: RPR Ser Ql: NONREACTIVE

## 2012-01-01 NOTE — Patient Instructions (Addendum)
   Your procedure is scheduled on: Monday, Oct 14  Enter through the Main Entrance of [facility] at: 6 am Pick up the phone at the desk and dial [phone] and inform us of your arrival.  Please call this number if you have any problems the morning of surgery: [phone]  Remember: Do not eat food after midnight: Sunday Do not drink clear liquids after: Sunday Take these medicines the morning of surgery with a SIP OF WATER: methimazole  Do not wear jewelry, make-up, or FINGER nail polish No metal in your hair or on your body. Do not wear lotions, powders, perfumes. You may wear deodorant.  Please use your CHG wash as directed prior to surgery.  Do not shave anywhere for at least 12 hours prior to first CHG shower.  Do not bring valuables to the hospital. Contacts, dentures or bridgework may not be worn into surgery.  Leave suitcase in the car. After Surgery it may be brought to your room. For patients being admitted to the hospital, checkout time is 11:00am the day of discharge.  Home with husband Homero Fellers cell [phone].  Patients discharged on the day of surgery will not be allowed to drive home.

## 2012-01-09 NOTE — H&P (Signed)
36 year old G 3 P 2 presents for Repeat LTCS.  She has had 2 previous C sections.  PNC see hollister GBBS negative history of hyperthyroidism  Afebrile Vital signs stable General alert and oriented Lung CTAB Car RRR Abdomen is soft and nontender  Impression IUP at term Previous C Section x 2  Plan Repeat LTCS  Risks reviewed with patient

## 2012-01-09 NOTE — Pre-Procedure Instructions (Signed)
Pt notified of date and time change of her surgery. She was aware that her surgery date had been changed to tomorrow, 01/10/12 but did not know she is to be here at 0700 am. She said she may be a few minutes late due to child care issues. I stressed the importance of her arriving on time.

## 2012-01-10 ENCOUNTER — Encounter (HOSPITAL_COMMUNITY): Payer: Self-pay | Admitting: Anesthesiology

## 2012-01-10 ENCOUNTER — Inpatient Hospital Stay (HOSPITAL_COMMUNITY)
Admission: RE | Admit: 2012-01-10 | Discharge: 2012-01-13 | DRG: 371 | Disposition: A | Discharge status: Home / Self Care | Payer: BC Managed Care – PPO | Source: Ambulatory Visit | Attending: Obstetrics and Gynecology | Admitting: Obstetrics and Gynecology

## 2012-01-10 ENCOUNTER — Inpatient Hospital Stay (HOSPITAL_COMMUNITY): Payer: BC Managed Care – PPO | Admitting: Anesthesiology

## 2012-01-10 ENCOUNTER — Encounter (HOSPITAL_COMMUNITY): Payer: Self-pay | Admitting: *Deleted

## 2012-01-10 ENCOUNTER — Encounter (HOSPITAL_COMMUNITY)
Admission: RE | Disposition: A | Discharge status: Home / Self Care | Payer: Self-pay | Source: Ambulatory Visit | Attending: Obstetrics and Gynecology

## 2012-01-10 DIAGNOSIS — Z98891 History of uterine scar from previous surgery: Secondary | ICD-10-CM

## 2012-01-10 DIAGNOSIS — O09529 Supervision of elderly multigravida, unspecified trimester: Secondary | ICD-10-CM | POA: Diagnosis present

## 2012-01-10 DIAGNOSIS — O34219 Maternal care for unspecified type scar from previous cesarean delivery: Principal | ICD-10-CM | POA: Diagnosis present

## 2012-01-10 LAB — TYPE AND SCREEN
ABO/RH(D): O POS
Antibody Screen: NEGATIVE

## 2012-01-10 SURGERY — Surgical Case
Anesthesia: Spinal | Wound class: Clean Contaminated

## 2012-01-10 MED ORDER — OXYTOCIN 40 UNITS IN LACTATED RINGERS INFUSION - SIMPLE MED: 62.5000 mL/h | INTRAVENOUS | Status: AC

## 2012-01-10 MED ORDER — LACTATED RINGERS IV SOLN: INTRAVENOUS | Status: DC

## 2012-01-10 MED ORDER — NALOXONE HCL 0.4 MG/ML IJ SOLN: 0.4000 mg | INTRAMUSCULAR | Status: DC | PRN

## 2012-01-10 MED ORDER — FLUTICASONE PROPIONATE 50 MCG/ACT NA SUSP: 2.0000 | Freq: Every day | NASAL | Status: DC | PRN

## 2012-01-10 MED ORDER — SIMETHICONE 80 MG PO CHEW: 80.0000 mg | CHEWABLE_TABLET | ORAL | Status: DC | PRN

## 2012-01-10 MED ORDER — ONDANSETRON HCL 4 MG PO TABS: 4.0000 mg | ORAL_TABLET | ORAL | Status: DC | PRN

## 2012-01-10 MED ORDER — BUPIVACAINE HCL (PF) 0.25 % IJ SOLN
INTRAMUSCULAR | Status: DC | PRN
  Administered 2012-01-10: 30 mL

## 2012-01-10 MED ORDER — OXYTOCIN 40 UNITS IN LACTATED RINGERS INFUSION - SIMPLE MED
INTRAVENOUS | Status: DC | PRN
  Administered 2012-01-10: 40 [IU] via INTRAVENOUS

## 2012-01-10 MED ORDER — LACTATED RINGERS IV SOLN
INTRAVENOUS | Status: DC
  Administered 2012-01-10: 13:00:00 via INTRAVENOUS

## 2012-01-10 MED ORDER — SCOPOLAMINE 1 MG/3DAYS TD PT72
MEDICATED_PATCH | TRANSDERMAL | Status: AC
  Administered 2012-01-10: 1.5 mg via TRANSDERMAL
  Filled 2012-01-10: qty 1

## 2012-01-10 MED ORDER — SODIUM CHLORIDE 0.9 % IJ SOLN: 3.0000 mL | INTRAMUSCULAR | Status: DC | PRN

## 2012-01-10 MED ORDER — KETOROLAC TROMETHAMINE 30 MG/ML IJ SOLN
30.0000 mg | Freq: Four times a day (QID) | INTRAMUSCULAR | Status: AC | PRN
  Administered 2012-01-10 (×2): 30 mg via INTRAVENOUS
  Filled 2012-01-10: qty 1

## 2012-01-10 MED ORDER — DIPHENHYDRAMINE HCL 50 MG/ML IJ SOLN: 25.0000 mg | INTRAMUSCULAR | Status: DC | PRN

## 2012-01-10 MED ORDER — DIPHENHYDRAMINE HCL 50 MG/ML IJ SOLN: 12.5000 mg | INTRAMUSCULAR | Status: DC | PRN

## 2012-01-10 MED ORDER — DIPHENHYDRAMINE HCL 25 MG PO CAPS: 25.0000 mg | ORAL_CAPSULE | Freq: Four times a day (QID) | ORAL | Status: DC | PRN

## 2012-01-10 MED ORDER — SODIUM CHLORIDE 0.9 % IV SOLN
1.0000 ug/kg/h | INTRAVENOUS | Status: DC | PRN
  Filled 2012-01-10: qty 2.5

## 2012-01-10 MED ORDER — MORPHINE SULFATE (PF) 0.5 MG/ML IJ SOLN
INTRAMUSCULAR | Status: DC | PRN
  Administered 2012-01-10: .1 mg via INTRATHECAL

## 2012-01-10 MED ORDER — METHIMAZOLE 5 MG PO TABS
5.0000 mg | ORAL_TABLET | Freq: Every day | ORAL | Status: DC
  Administered 2012-01-10: 5 mg via ORAL
  Administered 2012-01-11: 12:00:00 via ORAL
  Administered 2012-01-12: 10 mg via ORAL
  Administered 2012-01-13: 5 mg via ORAL
  Filled 2012-01-10 (×4): qty 1

## 2012-01-10 MED ORDER — BUPIVACAINE IN DEXTROSE 0.75-8.25 % IT SOLN
INTRATHECAL | Status: DC | PRN
  Administered 2012-01-10: 1.2 mL via INTRATHECAL

## 2012-01-10 MED ORDER — CEFAZOLIN SODIUM-DEXTROSE 2-3 GM-% IV SOLR
INTRAVENOUS | Status: AC
  Filled 2012-01-10: qty 50

## 2012-01-10 MED ORDER — CEFAZOLIN SODIUM-DEXTROSE 2-3 GM-% IV SOLR
2.0000 g | INTRAVENOUS | Status: AC
  Administered 2012-01-10: 2 g via INTRAVENOUS

## 2012-01-10 MED ORDER — METOCLOPRAMIDE HCL 5 MG/ML IJ SOLN: 10.0000 mg | Freq: Once | INTRAMUSCULAR | Status: DC | PRN

## 2012-01-10 MED ORDER — OXYCODONE-ACETAMINOPHEN 5-325 MG PO TABS
1.0000 | ORAL_TABLET | ORAL | Status: DC | PRN
  Administered 2012-01-11 – 2012-01-13 (×7): 1 via ORAL
  Filled 2012-01-10 (×7): qty 1

## 2012-01-10 MED ORDER — EPHEDRINE SULFATE 50 MG/ML IJ SOLN
INTRAMUSCULAR | Status: DC | PRN
  Administered 2012-01-10 (×3): 10 mg via INTRAVENOUS

## 2012-01-10 MED ORDER — IBUPROFEN 600 MG PO TABS
600.0000 mg | ORAL_TABLET | Freq: Four times a day (QID) | ORAL | Status: DC
  Administered 2012-01-11 – 2012-01-13 (×10): 600 mg via ORAL
  Filled 2012-01-10 (×7): qty 1

## 2012-01-10 MED ORDER — LANOLIN HYDROUS EX OINT: 1.0000 "application " | TOPICAL_OINTMENT | CUTANEOUS | Status: DC | PRN

## 2012-01-10 MED ORDER — LACTATED RINGERS IV SOLN
INTRAVENOUS | Status: DC | PRN
  Administered 2012-01-10 (×3): via INTRAVENOUS

## 2012-01-10 MED ORDER — WITCH HAZEL-GLYCERIN EX PADS: 1.0000 "application " | MEDICATED_PAD | CUTANEOUS | Status: DC | PRN

## 2012-01-10 MED ORDER — KETOROLAC TROMETHAMINE 30 MG/ML IJ SOLN
INTRAMUSCULAR | Status: AC
  Administered 2012-01-10: 30 mg via INTRAVENOUS
  Filled 2012-01-10: qty 1

## 2012-01-10 MED ORDER — ONDANSETRON HCL 4 MG/2ML IJ SOLN
INTRAMUSCULAR | Status: AC
  Filled 2012-01-10: qty 2

## 2012-01-10 MED ORDER — SENNOSIDES-DOCUSATE SODIUM 8.6-50 MG PO TABS
2.0000 | ORAL_TABLET | Freq: Every day | ORAL | Status: DC
  Administered 2012-01-10 – 2012-01-12 (×3): 2 via ORAL

## 2012-01-10 MED ORDER — PHENYLEPHRINE 40 MCG/ML (10ML) SYRINGE FOR IV PUSH (FOR BLOOD PRESSURE SUPPORT)
PREFILLED_SYRINGE | INTRAVENOUS | Status: AC
  Filled 2012-01-10: qty 5

## 2012-01-10 MED ORDER — NALBUPHINE SYRINGE 5 MG/0.5 ML
5.0000 mg | INJECTION | INTRAMUSCULAR | Status: DC | PRN
  Administered 2012-01-10: 10 mg via INTRAVENOUS
  Filled 2012-01-10: qty 1

## 2012-01-10 MED ORDER — DIBUCAINE 1 % RE OINT: 1.0000 "application " | TOPICAL_OINTMENT | RECTAL | Status: DC | PRN

## 2012-01-10 MED ORDER — FENTANYL CITRATE 0.05 MG/ML IJ SOLN
INTRAMUSCULAR | Status: AC
  Filled 2012-01-10: qty 2

## 2012-01-10 MED ORDER — EPHEDRINE 5 MG/ML INJ
INTRAVENOUS | Status: AC
  Filled 2012-01-10: qty 10

## 2012-01-10 MED ORDER — OXYTOCIN 10 UNIT/ML IJ SOLN
INTRAMUSCULAR | Status: AC
  Filled 2012-01-10: qty 4

## 2012-01-10 MED ORDER — DIPHENHYDRAMINE HCL 25 MG PO CAPS
25.0000 mg | ORAL_CAPSULE | ORAL | Status: DC | PRN
  Filled 2012-01-10: qty 1

## 2012-01-10 MED ORDER — BUPIVACAINE HCL (PF) 0.25 % IJ SOLN
INTRAMUSCULAR | Status: AC
  Filled 2012-01-10: qty 30

## 2012-01-10 MED ORDER — METOCLOPRAMIDE HCL 5 MG/ML IJ SOLN: 10.0000 mg | Freq: Three times a day (TID) | INTRAMUSCULAR | Status: DC | PRN

## 2012-01-10 MED ORDER — ONDANSETRON HCL 4 MG/2ML IJ SOLN: 4.0000 mg | INTRAMUSCULAR | Status: DC | PRN

## 2012-01-10 MED ORDER — FENTANYL CITRATE 0.05 MG/ML IJ SOLN
INTRAMUSCULAR | Status: DC | PRN
  Administered 2012-01-10: 12.5 ug via INTRATHECAL
  Administered 2012-01-10: 37.5 ug via INTRAVENOUS

## 2012-01-10 MED ORDER — IBUPROFEN 600 MG PO TABS
600.0000 mg | ORAL_TABLET | Freq: Four times a day (QID) | ORAL | Status: DC | PRN
  Filled 2012-01-10 (×5): qty 1

## 2012-01-10 MED ORDER — MENTHOL 3 MG MT LOZG: 1.0000 | LOZENGE | OROMUCOSAL | Status: DC | PRN

## 2012-01-10 MED ORDER — SIMETHICONE 80 MG PO CHEW
80.0000 mg | CHEWABLE_TABLET | Freq: Three times a day (TID) | ORAL | Status: DC
  Administered 2012-01-10 – 2012-01-13 (×10): 80 mg via ORAL

## 2012-01-10 MED ORDER — ONDANSETRON HCL 4 MG/2ML IJ SOLN
INTRAMUSCULAR | Status: DC | PRN
  Administered 2012-01-10: 4 mg via INTRAVENOUS

## 2012-01-10 MED ORDER — HYDROMORPHONE HCL PF 1 MG/ML IJ SOLN
1.0000 mg | Freq: Once | INTRAMUSCULAR | Status: AC
  Administered 2012-01-10: 1 mg via INTRAVENOUS

## 2012-01-10 MED ORDER — MORPHINE SULFATE 0.5 MG/ML IJ SOLN
INTRAMUSCULAR | Status: AC
  Filled 2012-01-10: qty 10

## 2012-01-10 MED ORDER — ZOLPIDEM TARTRATE 5 MG PO TABS: 5.0000 mg | ORAL_TABLET | Freq: Every evening | ORAL | Status: DC | PRN

## 2012-01-10 MED ORDER — FENTANYL CITRATE 0.05 MG/ML IJ SOLN
25.0000 ug | INTRAMUSCULAR | Status: DC | PRN
  Administered 2012-01-10 (×2): 50 ug via INTRAVENOUS

## 2012-01-10 MED ORDER — FENTANYL CITRATE 0.05 MG/ML IJ SOLN
INTRAMUSCULAR | Status: AC
  Administered 2012-01-10: 50 ug via INTRAVENOUS
  Filled 2012-01-10: qty 2

## 2012-01-10 MED ORDER — ONDANSETRON HCL 4 MG/2ML IJ SOLN: 4.0000 mg | Freq: Three times a day (TID) | INTRAMUSCULAR | Status: DC | PRN

## 2012-01-10 MED ORDER — MEPERIDINE HCL 25 MG/ML IJ SOLN: 6.2500 mg | INTRAMUSCULAR | Status: DC | PRN

## 2012-01-10 MED ORDER — NALBUPHINE SYRINGE 5 MG/0.5 ML
5.0000 mg | INJECTION | INTRAMUSCULAR | Status: DC | PRN
  Filled 2012-01-10 (×2): qty 1

## 2012-01-10 MED ORDER — KETOROLAC TROMETHAMINE 30 MG/ML IJ SOLN: 30.0000 mg | Freq: Four times a day (QID) | INTRAMUSCULAR | Status: AC | PRN

## 2012-01-10 MED ORDER — TETANUS-DIPHTH-ACELL PERTUSSIS 5-2.5-18.5 LF-MCG/0.5 IM SUSP: 0.5000 mL | Freq: Once | INTRAMUSCULAR | Status: DC

## 2012-01-10 MED ORDER — PRENATAL MULTIVITAMIN CH
1.0000 | ORAL_TABLET | Freq: Every day | ORAL | Status: DC
  Administered 2012-01-11 – 2012-01-13 (×3): 1 via ORAL
  Filled 2012-01-10 (×3): qty 1

## 2012-01-10 MED ORDER — LACTATED RINGERS IV SOLN
INTRAVENOUS | Status: DC
  Administered 2012-01-10 (×2): via INTRAVENOUS

## 2012-01-10 MED ORDER — HYDROMORPHONE HCL PF 1 MG/ML IJ SOLN
INTRAMUSCULAR | Status: AC
  Filled 2012-01-10: qty 1

## 2012-01-10 MED ORDER — SCOPOLAMINE 1 MG/3DAYS TD PT72
1.0000 | MEDICATED_PATCH | Freq: Once | TRANSDERMAL | Status: DC
  Administered 2012-01-10: 1.5 mg via TRANSDERMAL

## 2012-01-10 SURGICAL SUPPLY — 30 items
BARRIER ADHS 3X4 INTERCEED (GAUZE/BANDAGES/DRESSINGS) IMPLANT
CLOTH BEACON ORANGE TIMEOUT ST (SAFETY) ×2 IMPLANT
CONTAINER PREFILL 10% NBF 15ML (MISCELLANEOUS) IMPLANT
DRAPE SURG 17X23 STRL (DRAPES) ×2 IMPLANT
DRSG COVADERM 4X10 (GAUZE/BANDAGES/DRESSINGS) IMPLANT
DURAPREP 26ML APPLICATOR (WOUND CARE) ×2 IMPLANT
ELECT REM PT RETURN 9FT ADLT (ELECTROSURGICAL) ×2
ELECTRODE REM PT RTRN 9FT ADLT (ELECTROSURGICAL) ×1 IMPLANT
EXTRACTOR VACUUM M CUP 4 TUBE (SUCTIONS) IMPLANT
GLOVE BIO SURGEON STRL SZ 6.5 (GLOVE) ×4 IMPLANT
GOWN PREVENTION PLUS LG XLONG (DISPOSABLE) ×6 IMPLANT
HEMOSTAT SURGICEL 2X3 (HEMOSTASIS) ×2 IMPLANT
KIT ABG SYR 3ML LUER SLIP (SYRINGE) IMPLANT
NEEDLE HYPO 22GX1.5 SAFETY (NEEDLE) IMPLANT
NEEDLE HYPO 25X5/8 SAFETYGLIDE (NEEDLE) ×2 IMPLANT
NS IRRIG 1000ML POUR BTL (IV SOLUTION) ×2 IMPLANT
PACK C SECTION WH (CUSTOM PROCEDURE TRAY) ×2 IMPLANT
PAD OB MATERNITY 4.3X12.25 (PERSONAL CARE ITEMS) IMPLANT
SLEEVE SCD COMPRESS KNEE MED (MISCELLANEOUS) IMPLANT
STAPLER VISISTAT 35W (STAPLE) IMPLANT
SUT CHROMIC 0 CTX 36 (SUTURE) ×4 IMPLANT
SUT PLAIN 0 NONE (SUTURE) IMPLANT
SUT PLAIN 2 0 XLH (SUTURE) IMPLANT
SUT VIC AB 0 CT1 27 (SUTURE) ×3
SUT VIC AB 0 CT1 27XBRD ANBCTR (SUTURE) ×3 IMPLANT
SUT VIC AB 4-0 KS 27 (SUTURE) IMPLANT
SYR CONTROL 10ML LL (SYRINGE) IMPLANT
TOWEL OR 17X24 6PK STRL BLUE (TOWEL DISPOSABLE) ×4 IMPLANT
TRAY FOLEY CATH 14FR (SET/KITS/TRAYS/PACK) ×2 IMPLANT
WATER STERILE IRR 1000ML POUR (IV SOLUTION) ×2 IMPLANT

## 2012-01-10 NOTE — Addendum Note (Signed)
Addendum  created 01/10/12 1645 by Graciela Husbands, CRNA   Modules edited:Notes Section

## 2012-01-10 NOTE — Brief Op Note (Signed)
01/10/2012  9:18 AM  PATIENT:  Abigail Kemp  36 y.o. female  PRE-OPERATIVE DIAGNOSIS:  Previous Cesarean Section x 2  POST-OPERATIVE DIAGNOSIS:  Same  PROCEDURE:  Procedure(s) (LRB) with comments: CESAREAN SECTION (N/A) Repeat Low Transverse  SURGEON:  Surgeon(s) and Role:    * Jeani Hawking, MD - Primary  PHYSICIAN ASSISTANT:   ASSISTANTS: none   ANESTHESIA:   spinal  EBL:  Total I/O In: 1900 [I.V.:1900] Out: 1000 [Urine:300; Blood:700]  BLOOD ADMINISTERED:none  DRAINS: Urinary Catheter (Foley)   LOCAL MEDICATIONS USED:  NONE  SPECIMEN:  No Specimen  DISPOSITION OF SPECIMEN:  N/A  COUNTS:  YES  TOURNIQUET:  * No tourniquets in log *  DICTATION: .Other Dictation: Dictation Number [id number]  PLAN OF CARE: Admit to inpatient   PATIENT DISPOSITION:  PACU - hemodynamically stable.   Delay start of Pharmacological VTE agent (>24hrs) due to surgical blood loss or risk of bleeding: not applicable

## 2012-01-10 NOTE — Progress Notes (Signed)
Pt c/o pain of an 8 out of 10. Pt does not want to move in bed, so anesthesiologist, Dr. Arby Barrette called. Telephone order for 1mg  dilaudid bolus to be given followed by high dose dilaudid PCA. Discussed medications with pt. Pt does not want to have a PCA at this moment because she said "I do not want to be sleepy." RN will give 1mg  of dilaudid but not PCA. Dr. Arby Barrette called to discuss change in order. Okay to just give 1mg  dilaudid.

## 2012-01-10 NOTE — Transfer of Care (Signed)
Immediate Anesthesia Transfer of Care Note  Patient: Abigail Kemp  Procedure(s) Performed: Procedure(s) (LRB) with comments: CESAREAN SECTION (N/A)  Patient Location: PACU  Anesthesia Type: Spinal  Level of Consciousness: awake, alert  and oriented  Airway & Oxygen Therapy: Patient Spontanous Breathing  Post-op Assessment: Report given to PACU RN and Post -op Vital signs reviewed and stable  Post vital signs: Reviewed and stable  Complications: No apparent anesthesia complications

## 2012-01-10 NOTE — Anesthesia Postprocedure Evaluation (Signed)
  Anesthesia Post-op Note  Patient: Abigail Kemp  Procedure(s) Performed: Procedure(s) (LRB) with comments: CESAREAN SECTION (N/A)  Patient Location: Mother/Baby  Anesthesia Type: Spinal  Level of Consciousness: awake, alert  and oriented  Airway and Oxygen Therapy: Patient Spontanous Breathing  Post-op Pain: mild  Post-op Assessment: Post-op Vital signs reviewed, Patient's Cardiovascular Status Stable, Pain level controlled, No headache, No backache and No residual motor weakness. Patient reported a tingling in toes bilaterally and asked how long it would take for that feeling to go away. She was able to move her legs well..The patient was instructed to have her RN contact Anesthesia Dept. If this sensation persisted and/or worsened. Dr. Arby Barrette informed of patient status.  Post-op Vital Signs: Reviewed and stable  Complications: No apparent anesthesia complications

## 2012-01-10 NOTE — Progress Notes (Signed)
History and physical on the chart. No significant changes Will proceed with Repeat LTCS Consent is signed. 

## 2012-01-10 NOTE — Op Note (Signed)
NAMEKYNLEI, HARGUS            ACCOUNT NO.:  [id number]  MEDICAL RECORD NO.:  [id number]  LOCATION:  9130                          FACILITY:  [facility]  PHYSICIAN:  Elijah Michaelis L. Dede Dobesh, M.D.DATE OF BIRTH:  March 07, 1976  DATE OF PROCEDURE:  01/10/2012 DATE OF DISCHARGE:                              OPERATIVE REPORT   PREOPERATIVE DIAGNOSES: 1. Intrauterine pregnancy at 40 weeks and 4 days. 2. Previous cesarean section x2.  POSTOPERATIVE DIAGNOSES: 1. Intrauterine pregnancy at 40 weeks and 4 days. 2. Previous cesarean section x2.  PROCEDURE:  Repeat low transverse cesarean section.  SURGEON:  Elisha Cooksey L. Vincente Poli, MD  ANESTHESIA:  Spinal.  COMPLICATIONS:  None.  DRAINS:  Foley catheter.  EBL:  Less than 500 mL.  PROCEDURE:  The patient was taken to the operating room.  Her spinal was placed.  She was prepped and draped.  A Foley catheter was inserted. Time-out was performed.  A low transverse incision was made, carried down to the fascia.  Fascia scored in the midline, extended laterally. Rectus muscles were separated in the midline.  The peritoneum was entered bluntly.  The peritoneal incision was stretched.  There were lots of adhesions in the lower uterine segment which I had to take down with careful attention to avoid injury to the bladder.  The bladder blade was inserted.  The lower uterine segment was identified and bladder flap was created without difficulty.  A low transverse incision was made in the uterus.  Uterus was entered using hemostat and amniotic fluid was clear.  The baby was in cephalic presentation, was delivered very easily with 1 pull of the vacuum with a pop-off.  The baby was a female infant, Apgars 9 at 1 minute, 9 at 5 minutes.  After the cord was clamped and cut, the baby was handed to the awaiting neonatal team and subsequently placed on skin to skin.  The cord blood was obtained.  The uterus was noted to be contracting normally and the placenta  was delivered manually and noted to be intact with a 3-vessel cord.  The uterus was exteriorized.  It was cleared of all clots and debris.  It was firm and the uterine incision was closed in 2 layers using 0 chromic in a running locked stitch.  With the area over the previous adhesions were noted which was where the incision was made, I placed a piece of Surgicel to hopefully help with hemostasis and also with adhesion prevention.  The uterus was returned to the abdomen.  The incision was inspected and noted to be completely hemostatic.  The peritoneum and rectus muscles were reapproximated using 0 Vicryl.  The fascia was closed using 0 Vicryl  and running locked stitch.  After noting hemostasis in the subcutaneous layer, the skin was closed with staples. All sponge, lap, and instrument counts were correct x2.  The patient went to recovery room in stable condition.     Spero Gunnels L. Vincente Poli, M.D.     Florestine Avers  D:  01/10/2012  T:  01/10/2012  Job:  [id number]

## 2012-01-10 NOTE — Anesthesia Postprocedure Evaluation (Signed)
  Anesthesia Post-op Note  Patient: Abigail Kemp  Procedure(s) Performed: Procedure(s) (LRB) with comments: CESAREAN SECTION (N/A)  Patient Location: PACU  Anesthesia Type: Spinal  Level of Consciousness: awake, alert  and oriented  Airway and Oxygen Therapy: Patient Spontanous Breathing  Post-op Pain: none  Post-op Assessment: Post-op Vital signs reviewed, Patient's Cardiovascular Status Stable, Respiratory Function Stable, Patent Airway, No signs of Nausea or vomiting, Pain level controlled, No headache, No backache, No residual numbness and No residual motor weakness  Post-op Vital Signs: Reviewed and stable  Complications: No apparent anesthesia complications

## 2012-01-10 NOTE — Anesthesia Preprocedure Evaluation (Signed)
Anesthesia Evaluation  Patient identified by MRN, date of birth, ID band Patient awake    Reviewed: Allergy & Precautions, H&P , NPO status , Patient's Chart, lab work & pertinent test results  History of Anesthesia Complications (+) PONV  Airway Mallampati: III TM Distance: >3 FB Neck ROM: Full    Dental No notable dental hx. (+) Teeth Intact   Pulmonary neg pulmonary ROS,  breath sounds clear to auscultation  Pulmonary exam normal       Cardiovascular negative cardio ROS  Rhythm:Regular Rate:Normal     Neuro/Psych  Headaches, negative psych ROS   GI/Hepatic negative GI ROS, Neg liver ROS,   Endo/Other  Hyperthyroidism   Renal/GU negative Renal ROS  negative genitourinary   Musculoskeletal negative musculoskeletal ROS (+)   Abdominal   Peds  Hematology negative hematology ROS (+) Blood dyscrasia, anemia ,   Anesthesia Other Findings   Reproductive/Obstetrics (+) Pregnancy Previous C/Section x 2                           Anesthesia Physical Anesthesia Plan  ASA: II  Anesthesia Plan: Spinal   Post-op Pain Management:    Induction: Intravenous  Airway Management Planned: Natural Airway  Additional Equipment:   Intra-op Plan:   Post-operative Plan:   Informed Consent: I have reviewed the patients History and Physical, chart, labs and discussed the procedure including the risks, benefits and alternatives for the proposed anesthesia with the patient or authorized representative who has indicated his/her understanding and acceptance.   Dental advisory given  Plan Discussed with: CRNA, Anesthesiologist and Surgeon  Anesthesia Plan Comments:         Anesthesia Quick Evaluation

## 2012-01-10 NOTE — Anesthesia Procedure Notes (Signed)
Spinal  Patient location during procedure: OR Start time: 01/10/2012 8:29 AM End time: 01/10/2012 8:34 AM Staffing Anesthesiologist: Sandrea Hughs Performed by: anesthesiologist  Preanesthetic Checklist Completed: patient identified, site marked, surgical consent, pre-op evaluation, timeout performed, IV checked, risks and benefits discussed and monitors and equipment checked Spinal Block Patient position: sitting Prep: DuraPrep Patient monitoring: heart rate, cardiac monitor, continuous pulse ox and blood pressure Approach: midline Location: L3-4 Injection technique: single-shot Needle Needle type: Sprotte  Needle gauge: 24 G Needle length: 9 cm Needle insertion depth: 4 cm Assessment Sensory level: T4

## 2012-01-11 ENCOUNTER — Encounter (HOSPITAL_COMMUNITY): Payer: Self-pay | Admitting: Obstetrics and Gynecology

## 2012-01-11 LAB — CBC
Hemoglobin: 9.7 g/dL — ABNORMAL LOW (ref 12.0–15.0)
MCH: 31.1 pg (ref 26.0–34.0)
MCHC: 34.5 g/dL (ref 30.0–36.0)
MCV: 90.1 fL (ref 78.0–100.0)
RBC: 3.12 MIL/uL — ABNORMAL LOW (ref 3.87–5.11)

## 2012-01-11 NOTE — Progress Notes (Signed)
Subjective: Postpartum Day 1: Cesarean Delivery Patient reports tolerating PO, + flatus and no problems voiding.    Objective: Vital signs in last 24 hours: Temp:  [97.7 F (36.5 C)-99.3 F (37.4 C)] 98.6 F (37 C) (10/17 0445) Pulse Rate:  [61-102] 97  (10/17 0445) Resp:  [12-20] 20  (10/17 0445) BP: (99-121)/(49-71) 115/64 mmHg (10/17 0445) SpO2:  [96 %-100 %] 96 % (10/17 0445)  Physical Exam:  General: alert and cooperative Lochia: appropriate Uterine Fundus: firm Incision: abd dressing noted with small drainage on dressing DVT Evaluation: No evidence of DVT seen on physical exam. No significant calf/ankle edema.   Basename 01/11/12 0515  HGB 9.7*  HCT 28.1*    Assessment/Plan: Status post Cesarean section. Doing well postoperatively.  Continue current care.  Meila Berke G 01/11/2012, 8:14 AM

## 2012-01-12 NOTE — Progress Notes (Signed)
Subjective: Postpartum Day 2: Cesarean Delivery Patient reports tolerating PO, + flatus and no problems voiding.    Objective: Vital signs in last 24 hours: Temp:  [97.7 F (36.5 C)-99 F (37.2 C)] 98.2 F (36.8 C) (10/18 0545) Pulse Rate:  [69-96] 69  (10/18 0545) Resp:  [16-18] 18  (10/18 0545) BP: (100-117)/(53-70) 117/70 mmHg (10/18 0545) SpO2:  [96 %] 96 % (10/17 0815)  Physical Exam:  General: alert and cooperative Lochia: appropriate Uterine Fundus: firm Incision: abd dressing noted with small drainage noted on bandage DVT Evaluation: No evidence of DVT seen on physical exam. No significant calf/ankle edema.   Basename 01/11/12 0515  HGB 9.7*  HCT 28.1*    Assessment/Plan: Status post Cesarean section. Doing well postoperatively.  Continue current care.  Madalaine Portier G 01/12/2012, 8:10 AM

## 2012-01-13 DIAGNOSIS — Z98891 History of uterine scar from previous surgery: Secondary | ICD-10-CM

## 2012-01-13 MED ORDER — OXYCODONE-ACETAMINOPHEN 7.5-500 MG PO TABS: 1.0000 | ORAL_TABLET | ORAL | Status: DC | PRN

## 2012-01-13 NOTE — Progress Notes (Signed)
Subjective: Postpartum Day three: Cesarean Delivery Patient reports incisional pain, tolerating PO, + flatus and no problems voiding.    Objective: Vital signs in last 24 hours: Temp:  [97.5 F (36.4 C)-98.6 F (37 C)] 97.5 F (36.4 C) (10/19 0556) Pulse Rate:  [66-83] 66  (10/19 0556) Resp:  [18-20] 20  (10/19 0556) BP: (105-120)/(58-67) 105/58 mmHg (10/19 0556) SpO2:  [99 %-100 %] 100 % (10/18 2014)  Physical Exam:  General: alert Lochia: appropriate Uterine Fundus: firm Incision: healing well DVT Evaluation: No evidence of DVT seen on physical exam.   Basename 01/11/12 0515  HGB 9.7*  HCT 28.1*    Assessment/Plan: Status post Cesarean section. Doing well postoperatively.  Discharge home with standard precautions and return to clinic in 4-6 weeks.  Abigail Kemp S 01/13/2012, 9:45 AM

## 2012-01-25 NOTE — Discharge Summary (Signed)
Obstetric Discharge Summary Reason for Admission: cesarean section Prenatal Procedures: none Intrapartum Procedures: cesarean: low cervical, transverse Postpartum Procedures: none Complications-Operative and Postpartum: none Hemoglobin  Date Value Range Status  01/11/2012 9.7* 12.0 - 15.0 g/dL Final     HCT  Date Value Range Status  01/11/2012 28.1* 36.0 - 46.0 % Final    Physical Exam:  General: alert Lochia: appropriate Uterine Fundus: firm Incision: healing well DVT Evaluation: No evidence of DVT seen on physical exam.  Discharge Diagnoses: Term Pregnancy-delivered  Discharge Information: Date: 01/25/2012 Activity: pelvic rest Diet: routine Medications: Percocet Condition: stable Instructions: refer to practice specific booklet Discharge to: home   Newborn Data: Live born female  Birth Weight: 8 lb 11.2 oz (3945 g) APGAR: 9, 9  Home with mother.  Dixie Jafri S 01/25/2012, 7:21 AM

## 2012-02-28 ENCOUNTER — Other Ambulatory Visit: Payer: Self-pay | Admitting: Obstetrics and Gynecology

## 2013-09-16 ENCOUNTER — Ambulatory Visit: Payer: BC Managed Care – PPO | Admitting: Internal Medicine

## 2013-11-03 ENCOUNTER — Telehealth: Payer: Self-pay | Admitting: Family Medicine

## 2013-11-03 NOTE — Telephone Encounter (Signed)
Patient Information:  Caller Name: [name]  Phone: [phone]  Patient: Abigail Kemp, Abigail Kemp  Gender: Female  DOB: 1976-01-25  Age: 38 Years  PCP: [facility]  Pregnant: No  Office Follow Up:  Does the office need to follow up with this patient?: No  Instructions For The Office: N/A   Symptoms  Reason For Call & Symptoms: 10/31/13 0200 Pt ate tortilla chips and around 0930 became itchy and later found that she was covered in hives.   Took Benadyl.  11/04/13 continuing to take Benadryl 50mg  q 6 hours and continues to have widespread hives (large red whelps, move around, itchy) appearing.  Afebrile.  Advised pt of need for appt today, no appts available at [facility] location, offiered appt at [facility] office, pt unsure that she could make it there and requests appt on 11/04/13.  Per pt request, appt made 11/04/13 at 0800 with Dr Ermalene SearingBedsole.  Reviewed Health History In EMR: Yes  Reviewed Medications In EMR: Yes  Reviewed Allergies In EMR: Yes  Reviewed Surgeries / Procedures: Yes  Date of Onset of Symptoms: 10/31/2013  Treatments Tried: Benadryl 50mg  q 6 hours.  Treatments Tried Worked: No OB / GYN:  LMP: 10/29/2013  Guideline(s) Used:  Hives  Disposition Per Guideline:   See Today in Office  Reason For Disposition Reached:   Moderate-Severe hives persist (i.e., hives interfere with normal activities or work) and taking antihistamine (e.g., Benadryl, Claritin) > 24 hours  Advice Given:  Widespread Hives:  Take a cool bath for 10 minutes to relieve itching. Rub very itchy areas with an ice cube for 10 minutes.  Antihistamine  Benadryl (diphenhydramine) is an antihistamine. The adult dose is 25-50 mg. If the hives are still present after 6 hours, repeat the Benadryl.  Call Back If:  You become worse.  Patient Refused Recommendation:  Patient Refused Appt, Patient Requests Appt At Later Date  Offered pt an appt at [facility] location, pt unsure she could make it there  today and requests appt on 11/04/13 instead.

## 2013-11-04 ENCOUNTER — Ambulatory Visit (INDEPENDENT_AMBULATORY_CARE_PROVIDER_SITE_OTHER): Payer: BC Managed Care – PPO | Admitting: Family Medicine

## 2013-11-04 ENCOUNTER — Encounter: Payer: Self-pay | Admitting: Family Medicine

## 2013-11-04 VITALS — BP 98/68 | HR 86 | Temp 98.2°F | Ht 60.75 in | Wt 118.2 lb

## 2013-11-04 DIAGNOSIS — L500 Allergic urticaria: Secondary | ICD-10-CM

## 2013-11-04 MED ORDER — PREDNISONE 20 MG PO TABS: ORAL_TABLET | ORAL | Status: DC

## 2013-11-04 NOTE — Assessment & Plan Note (Signed)
Uncear trigger.  Treat with steroid taper and antihistamines.

## 2013-11-04 NOTE — Progress Notes (Signed)
   Subjective:    Patient ID: Abigail Kemp, female    DOB: 03/25/1976, 38 y.o.   MRN: [id number]  Urticaria Pertinent negatives include no eye pain, fatigue, fever or shortness of breath.    38 year old female pt of  Dr. Royden Purlower's presents for new onset  Hives off and on in last 4 days. On legs, chest arms, face, back. Rash is itchy, no blister, no pustules. No new exposures except she has eaten tostitos chips. She has been using benadryl.  Yesterday noted lip swelling, gone now. NO difficulty swallowing or breathing.  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   Review of Systems  Constitutional: Negative for fever and fatigue.  HENT: Negative for ear pain.   Eyes: Negative for pain.  Respiratory: Negative for chest tightness and shortness of breath.   Cardiovascular: Negative for chest pain, palpitations and leg swelling.  Gastrointestinal: Negative for abdominal pain.  Genitourinary: Negative for dysuria.       Objective:   Physical Exam  Constitutional: Vital signs are normal. She appears well-developed and well-nourished. She is cooperative.  Non-toxic appearance. She does not appear ill. No distress.  HENT:  Head: Normocephalic.  Right Ear: Hearing, tympanic membrane, external ear and ear canal normal. Tympanic membrane is not erythematous, not retracted and not bulging.  Left Ear: Hearing, tympanic membrane, external ear and ear canal normal. Tympanic membrane is not erythematous, not retracted and not bulging.  Nose: No mucosal edema or rhinorrhea. Right sinus exhibits no maxillary sinus tenderness and no frontal sinus tenderness. Left sinus exhibits no maxillary sinus tenderness and no frontal sinus tenderness.  Mouth/Throat: Uvula is midline, oropharynx is clear and moist and mucous membranes are normal.  Eyes: Conjunctivae, EOM and lids are normal. Pupils are equal, round, and reactive to light. Lids are everted and swept, no foreign bodies found.  Neck: Trachea  normal and normal range of motion. Neck supple. Carotid bruit is not present. No mass and no thyromegaly present.  Cardiovascular: Normal rate, regular rhythm, S1 normal, S2 normal, normal heart sounds, intact distal pulses and normal pulses.  Exam reveals no gallop and no friction rub.   No murmur heard. Pulmonary/Chest: Effort normal and breath sounds normal. Not tachypneic. No respiratory distress. She has no decreased breath sounds. She has no wheezes. She has no rhonchi. She has no rales.  Abdominal: Soft. Normal appearance and bowel sounds are normal. There is no tenderness.  Neurological: She is alert.  Skin: Skin is warm, dry and intact. Rash noted. No purpura noted. Rash is macular. Rash is not pustular and not vesicular.  Hives on posterior thighs, ie. raised red whelps  Psychiatric: Her speech is normal and behavior is normal. Judgment and thought content normal. Her mood appears not anxious. Cognition and memory are normal. She does not exhibit a depressed mood.          Assessment & Plan:

## 2013-11-04 NOTE — Patient Instructions (Signed)
Take prednisone and claritin in AM take benadryl at night.  Got to ER if tongue swelling or difficulty breathing.

## 2013-11-04 NOTE — Progress Notes (Signed)
Pre visit review using our clinic review tool, if applicable. No additional management support is needed unless otherwise documented below in the visit note. 

## 2013-11-06 ENCOUNTER — Telehealth: Payer: Self-pay | Admitting: Family Medicine

## 2013-11-06 DIAGNOSIS — L500 Allergic urticaria: Secondary | ICD-10-CM

## 2013-11-06 NOTE — Telephone Encounter (Signed)
Pt was seen 11/04/13. She was prescribed prednisone and it is not helping with the hives. She is still having to take an antihistamine for the hives. She wants to know what the point of the prednisone is? Please advise.  #[phone]

## 2013-11-06 NOTE — Telephone Encounter (Signed)
Ms. Balz notified by telephoSabino Gasserne as instructed.  She would like to go ahead with the referral to an allergist.  I advised Bonita QuinLinda or Shirlee LimerickMarion would call her once they have her an appointment.

## 2013-11-06 NOTE — Telephone Encounter (Signed)
Let pt know the prednisone should help stop an allergic reaction but cannot help is she continues to have trigger. Recommend referral to allergist for further eval and treat if pt is agreeable.

## 2013-11-10 ENCOUNTER — Other Ambulatory Visit: Payer: Self-pay | Admitting: Obstetrics and Gynecology

## 2013-11-11 LAB — CYTOLOGY - PAP

## 2013-11-27 ENCOUNTER — Ambulatory Visit (INDEPENDENT_AMBULATORY_CARE_PROVIDER_SITE_OTHER): Payer: BC Managed Care – PPO | Admitting: General Surgery

## 2014-01-26 ENCOUNTER — Encounter: Payer: Self-pay | Admitting: Family Medicine

## 2014-05-12 IMAGING — US US OB LIMITED
1 series · 14 of 19 positions shown · non-contrast
Comparison: none

CLINICAL DATA: Vaginal spotting

[Series 1: us fetal bpp w/o nonstress · non-contrast · 19 acquisitions, 14 frames shown]
[im 1/19]
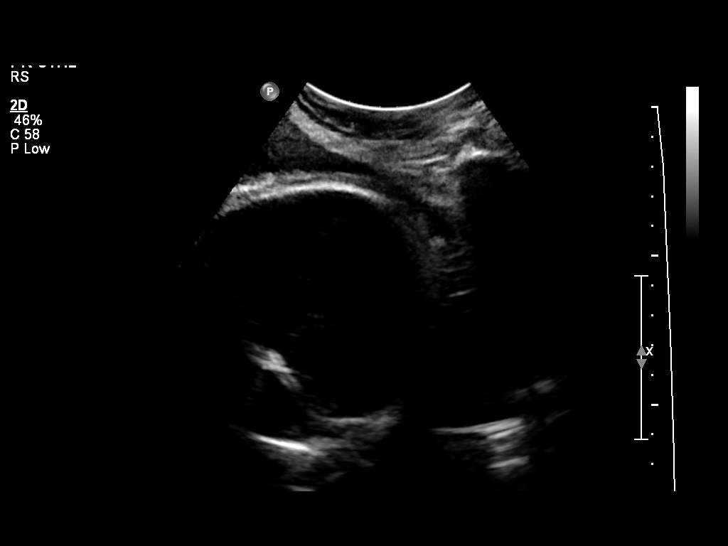
[im 3/19]
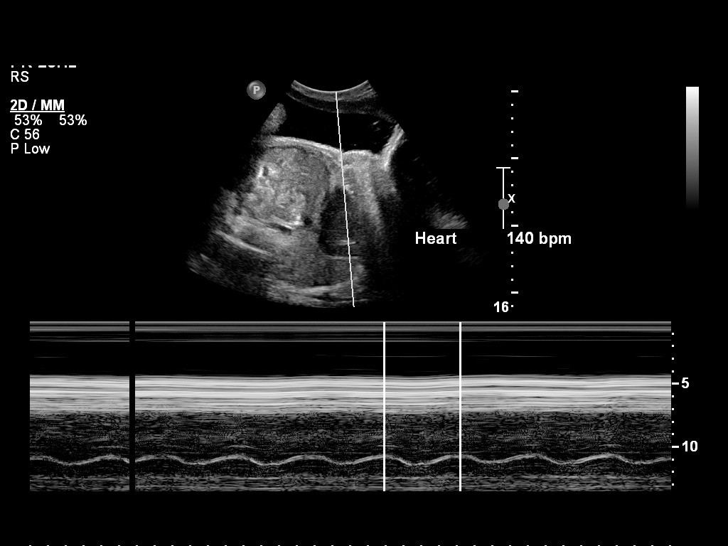
[im 4/19]
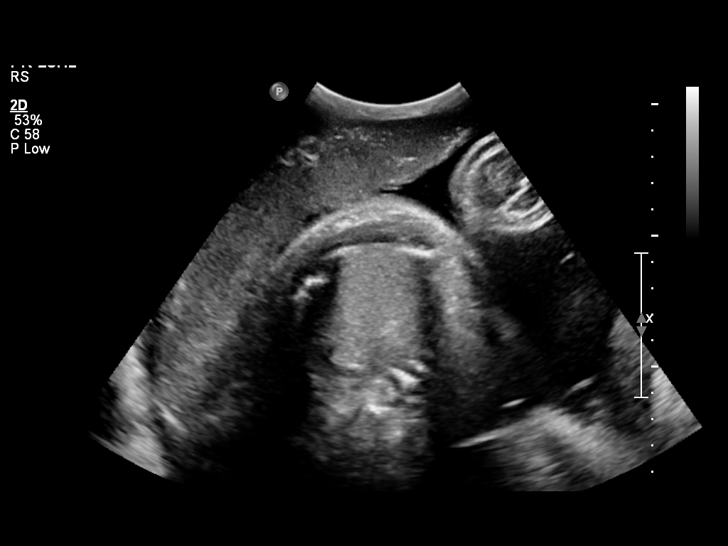
[im 5/19]
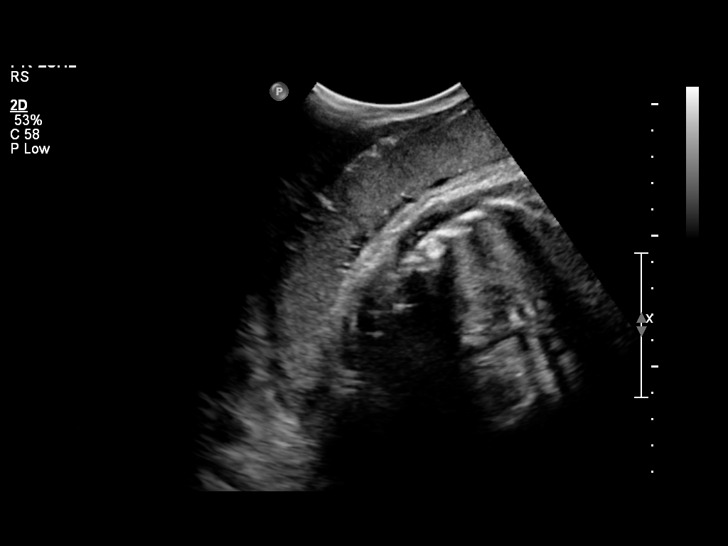
[im 7/19]
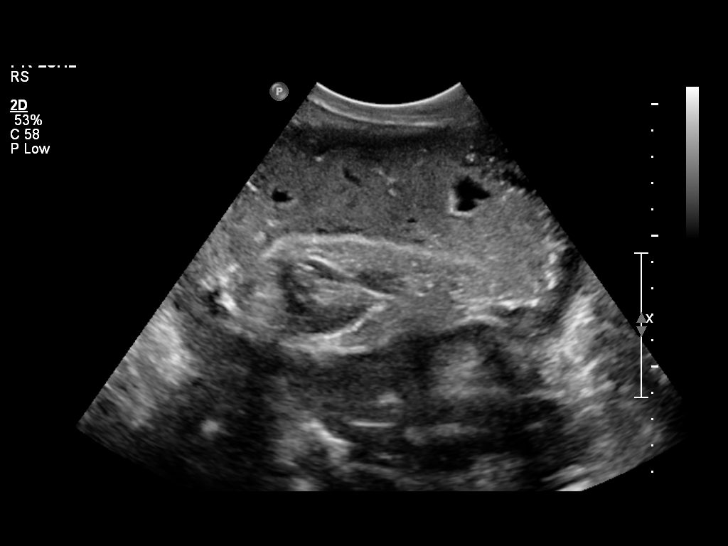
[im 8/19]
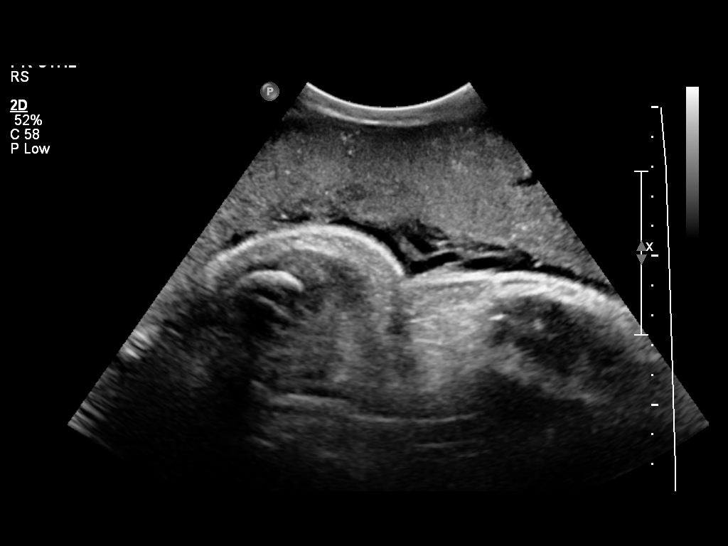
[im 9/19]
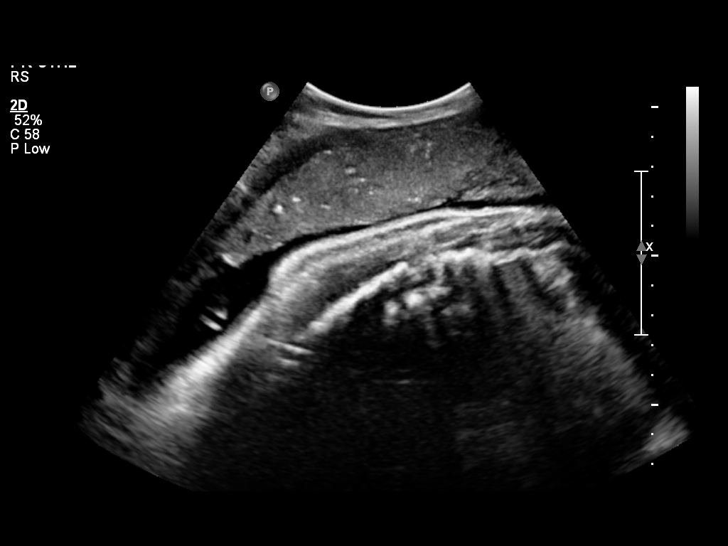
[im 11/19]
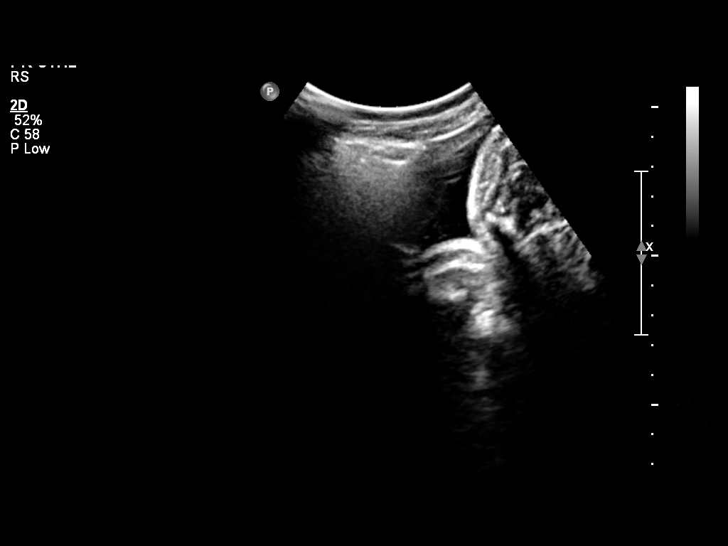
[im 12/19]
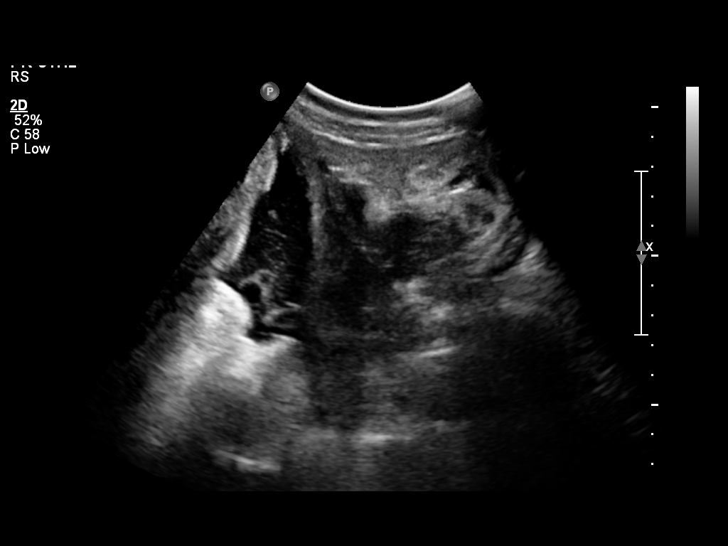
[im 13/19]
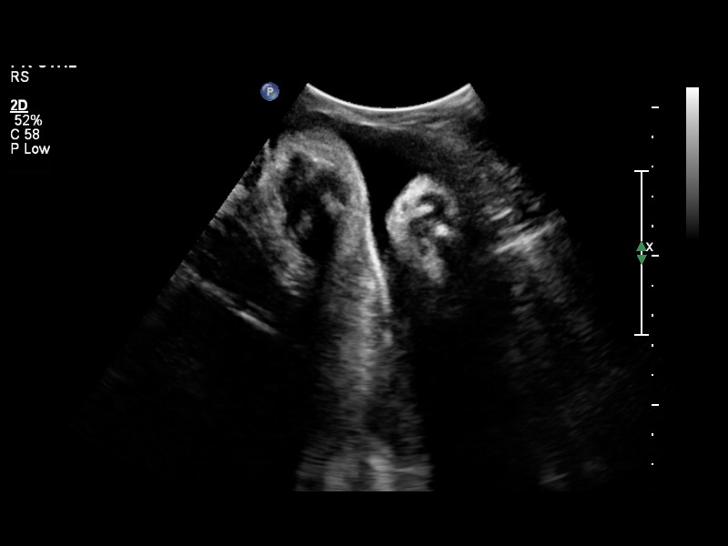
[im 15/19]
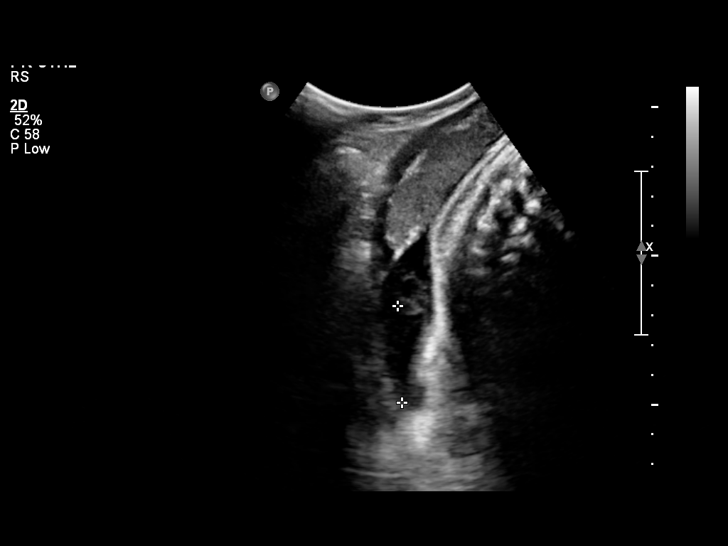
[im 16/19]
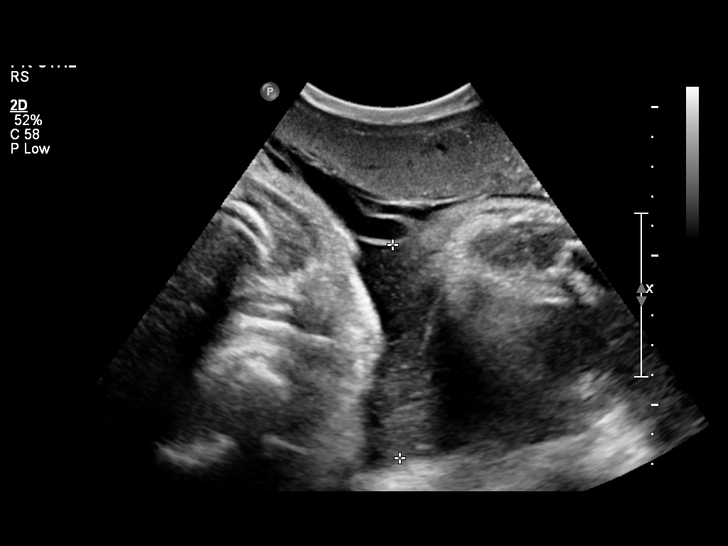
[im 17/19]
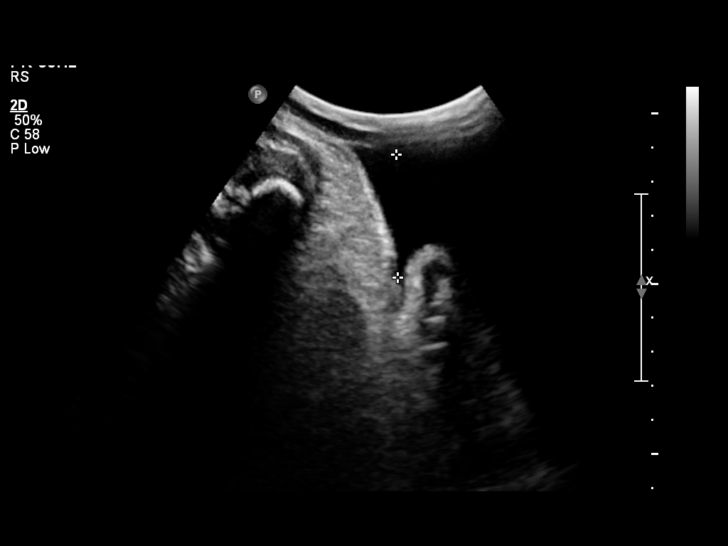
[im 19/19]
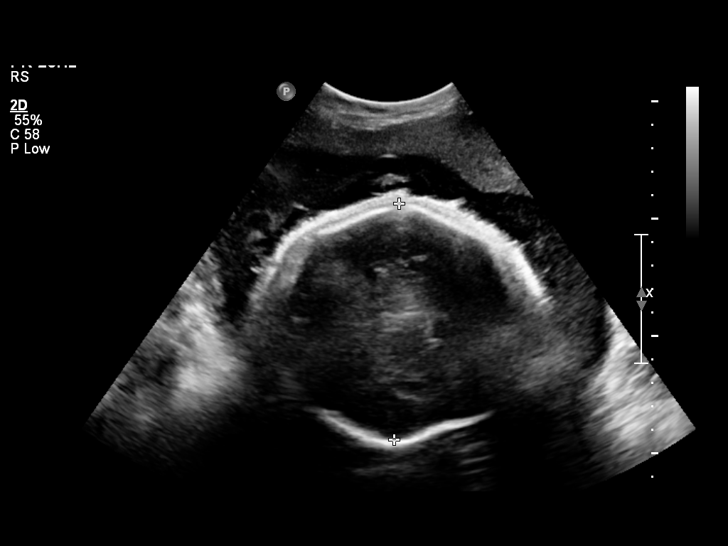

[14 of 19 positions shown; findings below may reference images not displayed]

LIMITED OBSTETRIC ULTRASOUND

Number of Fetuses: 1
Heart Rate: 140 bpm
Movement: Identified
Presentation: Cephalic
Placental Location: Fundal
Previa: Not identified
Amniotic Fluid (Subjective): Within normal limits

AFI: 18.6 cm (5%ile 7.3 cm, 95%ile 23.9 cm)

BPD: 10cm   41w   2d

MATERNAL FINDINGS:
Cervix: Not evaluate
Uterus/Adnexae: Within normal limits.  Ovaries not visualized.

Biophysical profile:  Time elapsed 5 minutes. Total score [DATE], with
normal movement, breathing, tone, and amniotic fluid.
IMPRESSION: Biophysical profile [DATE].

No placental previa identified.

## 2014-05-20 ENCOUNTER — Telehealth: Payer: Self-pay | Admitting: Family Medicine

## 2014-05-20 ENCOUNTER — Encounter: Payer: Self-pay | Admitting: Family Medicine

## 2014-05-20 ENCOUNTER — Ambulatory Visit (INDEPENDENT_AMBULATORY_CARE_PROVIDER_SITE_OTHER): Payer: BC Managed Care – PPO | Admitting: Family Medicine

## 2014-05-20 VITALS — BP 104/60 | HR 88 | Temp 98.4°F | Wt 118.5 lb

## 2014-05-20 DIAGNOSIS — B309 Viral conjunctivitis, unspecified: Secondary | ICD-10-CM

## 2014-05-20 DIAGNOSIS — J029 Acute pharyngitis, unspecified: Secondary | ICD-10-CM

## 2014-05-20 LAB — POCT RAPID STREP A (OFFICE): Rapid Strep A Screen: NEGATIVE

## 2014-05-20 NOTE — Telephone Encounter (Signed)
Pt has appt today at 12:30 with Dr Reece AgarG.

## 2014-05-20 NOTE — Progress Notes (Signed)
Pre visit review using our clinic review tool, if applicable. No additional management support is needed unless otherwise documented below in the visit note. 

## 2014-05-20 NOTE — Telephone Encounter (Signed)
Seen today. 

## 2014-05-20 NOTE — Assessment & Plan Note (Signed)
RST negative  Anticipate viral given short duration. Discussed supportive care as per instructions Red flags to return discussed.

## 2014-05-20 NOTE — Patient Instructions (Addendum)
You have acute pharyngitis, likely viral. Push fluids and plenty of rest. May use ibuprofen 400-60mg  twice daily with food for throat inflammation. Salt water gargles. Suck on cold things like popsicles or warm things like herbal teas (whichever soothes the throat better). Return if fever >101.5, worsening pain, or trouble opening/closing mouth, or hoarse voice. Good to see you today, call clinic with questions.  For pink eye - cool compresses on eye. Lubricating eye drop over the counter (rephresh or hypotears) and lots of hand washing. If worsening discharge or pain, let us know.  Return to see Dr Milinda Antisower for physical and thyroid check as you're due.

## 2014-05-20 NOTE — Telephone Encounter (Signed)
Patient Name: Abigail Kemp  DOB: 1975/05/17    Initial Comment caller states she has a bad sore throat , and red eyes    Nurse Assessment  Nurse: Scarlette ArStandifer, RN, Heather Date/Time (Eastern Time): 05/20/2014 10:51:06 AM  Confirm and document reason for call. If symptomatic, describe symptoms. ---Caller states that she started with a sore throat, headache and congestion a few days ago and she still has a sore throat, she has an eye that is red with discharge.  Has the patient traveled out of the country within the last 30 days? ---Not Applicable  Does the patient require triage? ---Yes  Related visit to physician within the last 2 weeks? ---No  Does the PT have any chronic conditions? (i.e. diabetes, asthma, etc.) ---No  Did the patient indicate they were pregnant? ---No     Guidelines    Guideline Title Affirmed Question Affirmed Notes  Sore Throat SEVERE (e.g., excruciating) throat pain    Final Disposition User   See Physician within 24 Hours Standifer, RN, Research scientist (physical sciences)Heather    Comments  Appt at 12:30 pm today with Dr. Sharen HonesGutierrez.

## 2014-05-20 NOTE — Progress Notes (Signed)
BP 104/60 mmHg  Pulse 88  Temp(Src) 98.4 F (36.9 C) (Oral)  Wt 118 lb 8 oz (53.751 kg)  LMP 05/01/2014   CC: ST  Subjective:    Patient ID: Abigail Kemp, female    DOB: November 02, 1975, 39 y.o.   MRN: [id number]  HPI: Abigail Kemp is a 39 y.o. female presenting on 05/20/2014 for Sore Throat   3d h/o ST progressively worsening. Also with red eye with discharge. + chills, significant congestion and mildly productive cough and PNDrainage. + headache initially.  No ear or tooth pain, dyspnea or chest pain.  Treated with allergy eye drops which have helped some. Also treated headache with ibuprofen.  No sick contacts at home. No smokers at home. No h/o asthma.   Relevant past medical, surgical, family and social history reviewed and updated as indicated. Interim medical history since our last visit reviewed. Allergies and medications reviewed and updated. Current Outpatient Prescriptions on File Prior to Visit  Medication Sig  . diphenhydrAMINE (BENADRYL) 25 MG tablet Take 25 mg by mouth as needed.  . fluticasone (FLONASE) 50 MCG/ACT nasal spray Place 2 sprays into the nose daily as needed. For allergies  . EPINEPHrine (EPIPEN 2-PAK) 0.3 mg/0.3 mL DEVI Inject 0.3 mg into the muscle once. Inject times one as needed for nut allergy    No current facility-administered medications on file prior to visit.    Review of Systems Per HPI unless specifically indicated above     Objective:    BP 104/60 mmHg  Pulse 88  Temp(Src) 98.4 F (36.9 C) (Oral)  Wt 118 lb 8 oz (53.751 kg)  LMP 05/01/2014  Wt Readings from Last 3 Encounters:  05/20/14 118 lb 8 oz (53.751 kg)  11/04/13 118 lb 4 oz (53.638 kg)  01/10/12 160 lb (72.576 kg)    Physical Exam  Constitutional: She appears well-developed and well-nourished. No distress.  Tired but nontoxic appearing  HENT:  Head: Normocephalic and atraumatic.  Right Ear: Hearing, tympanic membrane, external ear and ear canal normal.    Left Ear: Hearing, tympanic membrane, external ear and ear canal normal.  Nose: No mucosal edema or rhinorrhea. Right sinus exhibits no maxillary sinus tenderness and no frontal sinus tenderness. Left sinus exhibits no maxillary sinus tenderness and no frontal sinus tenderness.  Mouth/Throat: Uvula is midline and mucous membranes are normal. Posterior oropharyngeal erythema present. No oropharyngeal exudate, posterior oropharyngeal edema or tonsillar abscesses.  Eyes: EOM are normal. Pupils are equal, round, and reactive to light. Right conjunctiva is injected. Left conjunctiva is not injected. No scleral icterus.  Conjunctival injection with limbic sparing  Neck: Normal range of motion. Neck supple.  Cardiovascular: Normal rate, regular rhythm, normal heart sounds and intact distal pulses.   No murmur heard. Pulmonary/Chest: Effort normal and breath sounds normal. No respiratory distress. She has no wheezes. She has no rales.  Lymphadenopathy:    She has cervical adenopathy (shotty).  Skin: Skin is warm and dry. No rash noted.  Nursing note and vitals reviewed.      Assessment & Plan:   Problem List Items Addressed This Visit    Viral conjunctivitis    Discussed supportive care. No evidence of bacterial infection today. Red flags to update [place] for abx drops discussed.      Acute pharyngitis - Primary    RST negative  Anticipate viral given short duration. Discussed supportive care as per instructions Red flags to return discussed.       Other Visit Diagnoses  Sore throat        Relevant Orders    POCT rapid strep A (Completed)        Follow up plan: Return if symptoms worsen or fail to improve.

## 2014-05-20 NOTE — Assessment & Plan Note (Signed)
Discussed supportive care. No evidence of bacterial infection today. Red flags to update us for abx drops discussed.

## 2014-05-26 ENCOUNTER — Other Ambulatory Visit (INDEPENDENT_AMBULATORY_CARE_PROVIDER_SITE_OTHER): Payer: BC Managed Care – PPO

## 2014-05-26 ENCOUNTER — Ambulatory Visit: Payer: BC Managed Care – PPO | Admitting: Family Medicine

## 2014-05-26 DIAGNOSIS — R5383 Other fatigue: Secondary | ICD-10-CM

## 2014-05-26 LAB — TSH: TSH: 0.45 u[IU]/mL (ref 0.35–4.50)

## 2014-05-27 ENCOUNTER — Encounter: Payer: Self-pay | Admitting: *Deleted

## 2014-05-29 ENCOUNTER — Ambulatory Visit: Payer: Self-pay | Admitting: Family Medicine

## 2014-05-29 ENCOUNTER — Telehealth: Payer: Self-pay | Admitting: Family Medicine

## 2014-05-29 NOTE — Telephone Encounter (Signed)
Pt has appt to see Dr Para Marchuncan today at 3:15.

## 2014-05-29 NOTE — Telephone Encounter (Signed)
[facility] - Client TELEPHONE ADVICE RECORD [facility]  Patient Name: Abigail Kemp  Gender: Female  DOB: 05/17/1975   Age: [age] Y 3 M 20 D  Return Phone Number: [phone] (Primary)  Address: [address]   City[place]State[place]Zip: [place] [address]   Client [facility]  Client [facility]  [facility]  Physician Eustaquio BoydenGutierrez, Javier   Contact Type Call  Call Type Triage / Clinical  Caller Name Abigail ChainMaquisha Paye  Relationship To Patient Self     Return Phone Number [phone] (Primary)  Chief Complaint Sore Throat  Initial Comment Caller states she has a sore throat, she was seen for this last week. Pain got a little better, than this morning got really painful.      PreDisposition Call Doctor         Nurse Assessment  Nurse: Deloria LairHamilton, RN, Trish Date/Time Lamount Cohen(Eastern Time): 05/29/2014 11:56:41 AM  Confirm and document reason for call. If symptomatic, describe symptoms. ---Patient is calling for self and caller states she has a sore throat, she was seen for this last week. Pain got a little better, than this morning got really painful. Has congestion. Pain radiates to ear. No temp.  Has the patient traveled out of the country within the last 30 days? ---No  Does the patient require triage? ---Yes  Related visit to physician within the last 2 weeks? ---Yes  Does the PT have any chronic conditions? (i.e. diabetes, asthma, etc.) ---No  Did the patient indicate they were pregnant? ---No     Guidelines      Guideline Title Affirmed Question Affirmed Notes Nurse Date/Time Lamount Cohen(Eastern Time)  Sore Throat SEVERE (e.g., excruciating) throat pain  Hamilton, RN, Trish 05/29/2014 11:58:33 AM   Disp. Time Lamount Cohen(Eastern Time) Disposition Final User          05/29/2014 12:01:32 PM See Physician within 24 Hours Yes Deloria LairHamilton, RN, Trish        Caller Understands: Yes  Disagree/Comply: Comply      Care Advice Given Per Guideline      SEE PHYSICIAN WITHIN 24 HOURS: SORE THROAT - For relief of sore throat: * Sip warm chicken broth or apple juice. * Suck on hard candy or a throat lozenge (OTC). * Gargle with warm salt water four times a day. To make salt water, put 1/2 teaspoon of salt in 8 oz (240 ml) of warm water. * Avoid cigarette smoke. CALL BACK IF: * You become worse. CARE ADVICE given per Sore Throat (Adult) guideline. ACETAMINOPHEN (E.G., TYLENOL): * Take 650 mg (two 325 mg pills) by mouth every 4-6 hours as needed. Each Regular Strength Tylenol pill has 325 mg of acetaminophen. The most you should take each day is 3,250 mg (10 Regular Strength pills a day). * Eat a soft diet. Cold drinks, popsicles, and milk shakes are especially good. Avoid citrus fruits. * Drink plenty of liquids so as to avoid dehydration (8-12 eight oz glasses each day).   After Care Instructions Given     Call Event Type User Date / Time Description        Referrals

## 2014-05-31 NOTE — Telephone Encounter (Signed)
Had appointment but cancelled.  Routed to PCP as FYI.

## 2014-06-01 ENCOUNTER — Ambulatory Visit: Payer: BC Managed Care – PPO | Admitting: Family Medicine

## 2014-11-04 ENCOUNTER — Telehealth: Payer: Self-pay | Admitting: Family Medicine

## 2014-11-04 DIAGNOSIS — Z0000 Encounter for general adult medical examination without abnormal findings: Secondary | ICD-10-CM | POA: Insufficient documentation

## 2014-11-04 NOTE — Telephone Encounter (Signed)
-----   Message from Alvina Chou sent at 11/04/2014  3:14 PM EDT ----- Regarding: Lab orders for Wednesday, 8.17.16 Patient is scheduled for CPX labs, please order future labs, Thanks , Camelia Eng

## 2014-11-11 ENCOUNTER — Other Ambulatory Visit: Payer: BC Managed Care – PPO

## 2014-11-18 ENCOUNTER — Encounter: Payer: BC Managed Care – PPO | Admitting: Family Medicine

## 2014-11-19 ENCOUNTER — Telehealth: Payer: Self-pay | Admitting: Family Medicine

## 2014-11-19 NOTE — Telephone Encounter (Signed)
She can call back herself to schedule

## 2014-11-19 NOTE — Telephone Encounter (Signed)
Pt did not come in for their appt today for CPE. Please let me know if pt needs to be contacted immediately for follow up or no follow up needed. Best phone number to contact pt is [phone].

## 2014-11-20 NOTE — Telephone Encounter (Signed)
Reschedule pt with labs prior. Pt is aware and a paper reminder was sent.

## 2014-12-14 ENCOUNTER — Other Ambulatory Visit (INDEPENDENT_AMBULATORY_CARE_PROVIDER_SITE_OTHER): Payer: BC Managed Care – PPO

## 2014-12-14 DIAGNOSIS — E559 Vitamin D deficiency, unspecified: Secondary | ICD-10-CM | POA: Diagnosis not present

## 2014-12-14 DIAGNOSIS — Z0000 Encounter for general adult medical examination without abnormal findings: Secondary | ICD-10-CM

## 2014-12-14 LAB — COMPREHENSIVE METABOLIC PANEL
ALT: 9 U/L (ref 0–35)
AST: 13 U/L (ref 0–37)
Albumin: 4.1 g/dL (ref 3.5–5.2)
Alkaline Phosphatase: 51 U/L (ref 39–117)
BILIRUBIN TOTAL: 0.3 mg/dL (ref 0.2–1.2)
BUN: 11 mg/dL (ref 6–23)
CO2: 24 meq/L (ref 19–32)
CREATININE: 0.7 mg/dL (ref 0.40–1.20)
Calcium: 8.8 mg/dL (ref 8.4–10.5)
Chloride: 109 mEq/L (ref 96–112)
GFR: 119.28 mL/min (ref >60.00)
GLUCOSE: 98 mg/dL (ref 70–99)
Potassium: 4.5 mEq/L (ref 3.5–5.1)
SODIUM: 138 meq/L (ref 135–145)
Total Protein: 7.6 g/dL (ref 6.0–8.3)

## 2014-12-14 LAB — CBC WITH DIFFERENTIAL/PLATELET
BASOS ABS: 0 10*3/uL (ref 0.0–0.1)
Basophils Relative: 0.4 % (ref 0.0–3.0)
EOS ABS: 0.2 10*3/uL (ref 0.0–0.7)
Eosinophils Relative: 3.7 % (ref 0.0–5.0)
HCT: 34.6 % — ABNORMAL LOW (ref 36.0–46.0)
Hemoglobin: 11.5 g/dL — ABNORMAL LOW (ref 12.0–15.0)
LYMPHS ABS: 1.6 10*3/uL (ref 0.7–4.0)
LYMPHS PCT: 34.4 % (ref 12.0–46.0)
MCHC: 33.4 g/dL (ref 30.0–36.0)
MCV: 87.6 fl (ref 78.0–100.0)
MONO ABS: 0.4 10*3/uL (ref 0.1–1.0)
Monocytes Relative: 8.2 % (ref 3.0–12.0)
NEUTROS ABS: 2.4 10*3/uL (ref 1.4–7.7)
NEUTROS PCT: 53.3 % (ref 43.0–77.0)
PLATELETS: 214 10*3/uL (ref 150.0–400.0)
RBC: 3.95 Mil/uL (ref 3.87–5.11)
RDW: 14.6 % (ref 11.5–15.5)
WBC: 4.6 10*3/uL (ref 4.0–10.5)

## 2014-12-14 LAB — LIPID PANEL
CHOL/HDL RATIO: 3
Cholesterol: 104 mg/dL (ref 0–200)
HDL: 39.3 mg/dL (ref >39.00)
LDL CALC: 57 mg/dL (ref 0–99)
NONHDL: 64.63
Triglycerides: 36 mg/dL (ref 0.0–149.0)
VLDL: 7.2 mg/dL (ref 0.0–40.0)

## 2014-12-14 LAB — TSH: TSH: 0.2 u[IU]/mL — ABNORMAL LOW (ref 0.35–4.50)

## 2014-12-14 LAB — VITAMIN D 25 HYDROXY (VIT D DEFICIENCY, FRACTURES): VITD: 13.79 ng/mL — AB (ref 30.00–100.00)

## 2014-12-18 ENCOUNTER — Ambulatory Visit (INDEPENDENT_AMBULATORY_CARE_PROVIDER_SITE_OTHER): Payer: BC Managed Care – PPO | Admitting: Family Medicine

## 2014-12-18 ENCOUNTER — Encounter: Payer: Self-pay | Admitting: Family Medicine

## 2014-12-18 VITALS — BP 96/62 | HR 77 | Temp 99.1°F | Ht 60.5 in | Wt 118.0 lb

## 2014-12-18 DIAGNOSIS — D649 Anemia, unspecified: Secondary | ICD-10-CM

## 2014-12-18 DIAGNOSIS — Z23 Encounter for immunization: Secondary | ICD-10-CM | POA: Diagnosis not present

## 2014-12-18 DIAGNOSIS — E0590 Thyrotoxicosis, unspecified without thyrotoxic crisis or storm: Secondary | ICD-10-CM

## 2014-12-18 DIAGNOSIS — Z0000 Encounter for general adult medical examination without abnormal findings: Secondary | ICD-10-CM

## 2014-12-18 NOTE — Progress Notes (Signed)
Pre visit review using our clinic review tool, if applicable. No additional management support is needed unless otherwise documented below in the visit note. 

## 2014-12-18 NOTE — Progress Notes (Signed)
Subjective:    Patient ID: Abigail Kemp, female    DOB: Jun 07, 1975, 39 y.o.   MRN: [id number]  HPI Here for health maintenance exam and to review chronic medical problems    Doing well   Started back exercise to get in shape - a mix of things (crosstrains) Also eating healthy  bmi of 22  Wt is stable   Gyn - is due for annual exam in Jan Pap 8/15 nl   No abn paps recently   HIV screening was nl in 2013   Does not want a flu shot -does not want it for now   Vit D level is low - her mother has the same problem She is outside in the summer  Inside more this year  Has not broken any bones  She has been fatigued  She eats a lot of dairy and also leafy greens   Lab Results  Component Value Date   TSH 0.20* 12/14/2014    No symptoms  No unexplained weight loss  In the past free T4 has been fine  No goiter- has watched for that  Has been to endocinologist in [place] -has not gone for a while   Lab Results  Component Value Date   WBC 4.6 12/14/2014   HGB 11.5* 12/14/2014   HCT 34.6* 12/14/2014   MCV 87.6 12/14/2014   PLT 214.0 12/14/2014   eats a balanced diet Has heavy periods     Chemistry      Component Value Date/Time   NA 138 12/14/2014 0915   K 4.5 12/14/2014 0915   CL 109 12/14/2014 0915   CO2 24 12/14/2014 0915   BUN 11 12/14/2014 0915   CREATININE 0.70 12/14/2014 0915      Component Value Date/Time   CALCIUM 8.8 12/14/2014 0915   ALKPHOS 51 12/14/2014 0915   AST 13 12/14/2014 0915   ALT 9 12/14/2014 0915   BILITOT 0.3 12/14/2014 0915      Lab Results  Component Value Date   CHOL 104 12/14/2014   HDL 39.30 12/14/2014   LDLCALC 57 12/14/2014   TRIG 36.0 12/14/2014   CHOLHDL 3 12/14/2014   will keep exercising    Patient Active Problem List   Diagnosis Date Noted  . Routine general medical examination at a health care facility 11/04/2014  . Acute pharyngitis 05/20/2014  . 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 Status post repeat low  transverse cesarean section 01/13/2012    Class: Status post  . Pregnancy examination or test, positive result 05/22/2011  . Hernia, umbilical 05/22/2011  . School health examination 10/19/2010  . CERV HIGH RISK HUMAN PAPILLOMAVIRUS DNA TEST POS 05/24/2010  . Hyperthyroidism, subclinical 05/16/2010  . Anemia, mild 05/16/2010  . ALLERGIC RHINITIS 04/05/2010  . PERSONAL HISTORY OF ALLERGY TO OTHER FOODS 04/05/2010   Past Medical History  Diagnosis Date  . History of chicken pox   . Hx of migraines   . Subclinical hyperthyroidism   . PONV (postoperative nausea and vomiting)   . Anemia   . Headache(784.0)     otc med prn  . Seasonal allergies    Past Surgical History  Procedure Laterality Date  . Caesarean section   02-2001 and 07-2006    x 2  . Cesarean section    . Mouth surgery      wisdom teeth  . Wisdom tooth extraction    . Cesarean section  01/10/2012    Procedure: CESAREAN SECTION;  Surgeon: Jeani Hawking,  MD;  Location: [facility];  Service: Obstetrics;  Laterality: N/A;   Social History  Substance Use Topics  . Smoking status: Never Smoker   . Smokeless tobacco: Never Used  . Alcohol Use: No   Family History  Problem Relation Age of Onset  . Arthritis Mother   . Alcohol abuse Father   . Drug abuse Father   . Heart disease Maternal Grandmother   . Arthritis Maternal Grandfather   . Diabetes Maternal Grandfather   . Cancer Neg Hx    Allergies  Allergen Reactions  . Other Anaphylaxis    All nuts except peanuts   Current Outpatient Prescriptions on File Prior to Visit  Medication Sig Dispense Refill  . diphenhydrAMINE (BENADRYL) 25 MG tablet Take 25 mg by mouth as needed.    Marland Kitchen EPINEPHrine (EPIPEN 2-PAK) 0.3 mg/0.3 mL DEVI Inject 0.3 mg into the muscle once. Inject times one as needed for nut allergy     . fluticasone (FLONASE) 50 MCG/ACT nasal spray Place 2 sprays into the nose daily as needed. For allergies     No current facility-administered medications  on file prior to visit.    Review of Systems Review of Systems  Constitutional: Negative for fever, appetite change, fatigue and unexpected weight change.  Eyes: Negative for pain and visual disturbance.  Respiratory: Negative for cough and shortness of breath.   Cardiovascular: Negative for cp or palpitations    Gastrointestinal: Negative for nausea, diarrhea and constipation.  Genitourinary: Negative for urgency and frequency.  Skin: Negative for pallor or rash   Neurological: Negative for weakness, light-headedness, numbness and headaches.  Hematological: Negative for adenopathy. Does not bruise/bleed easily.  Psychiatric/Behavioral: Negative for dysphoric mood. The patient is not nervous/anxious.         Objective:   Physical Exam  Constitutional: She appears well-developed and well-nourished. No distress.  Well appearing   HENT:  Head: Normocephalic and atraumatic.  Right Ear: External ear normal.  Left Ear: External ear normal.  Mouth/Throat: Oropharynx is clear and moist.  Eyes: Conjunctivae and EOM are normal. Pupils are equal, round, and reactive to light. No scleral icterus.  Neck: Normal range of motion. Neck supple. No JVD present. Carotid bruit is not present. No thyromegaly present.  Symmetric thyroid   Cardiovascular: Normal rate, regular rhythm, normal heart sounds and intact distal pulses.  Exam reveals no gallop.   Pulmonary/Chest: Effort normal and breath sounds normal. No respiratory distress. She has no wheezes. She exhibits no tenderness.  Abdominal: Soft. Bowel sounds are normal. She exhibits no distension, no abdominal bruit and no mass. There is no tenderness.  Musculoskeletal: Normal range of motion. She exhibits no edema or tenderness.  Lymphadenopathy:    She has no cervical adenopathy.  Neurological: She is alert. She has normal reflexes. No cranial nerve deficit. She exhibits normal muscle tone. Coordination normal.  Skin: Skin is warm and dry. No  rash noted. No erythema. No pallor.  Psychiatric: She has a normal mood and affect.          Assessment & Plan:   Problem List Items Addressed This Visit      Endocrine   Hyperthyroidism, subclinical - Primary    Continue f/u with endocrinology  No symptoms         Other   Anemia, mild    Hx of heavy menses  Improving  Disc taking mvi with iron daily      Routine general medical examination at a health care facility  Reviewed health habits including diet and exercise and skin cancer prevention Reviewed appropriate screening tests for age  Also reviewed health mt list, fam hx and immunization status , as well as social and family history   See HPI Labs reviewed  Will work on replacing vit D       Other Visit Diagnoses    Need for Tdap vaccination        Relevant Orders    Tdap vaccine greater than or equal to 7yo IM (Completed)

## 2014-12-18 NOTE — Patient Instructions (Signed)
Get vitamin D3 over the counter and take 6000 iu daily for 6 weeks (that will be three of the 2000 iu pills)  Then just take 1 pill (2000 iu) daily  For very mild anemia - a multi vitamin once daily with iron is ok   Keep exercising Take care of yourself

## 2014-12-20 ENCOUNTER — Encounter: Payer: Self-pay | Admitting: Family Medicine

## 2014-12-20 DIAGNOSIS — E559 Vitamin D deficiency, unspecified: Secondary | ICD-10-CM | POA: Insufficient documentation

## 2014-12-20 NOTE — Assessment & Plan Note (Addendum)
Hx of heavy menses  Improving  Disc taking mvi with iron daily

## 2014-12-20 NOTE — Assessment & Plan Note (Signed)
Reviewed health habits including diet and exercise and skin cancer prevention Reviewed appropriate screening tests for age  Also reviewed health mt list, fam hx and immunization status , as well as social and family history   See HPI Labs reviewed  Will work on replacing vit D

## 2014-12-20 NOTE — Assessment & Plan Note (Signed)
Continue f/u with endocrinology  No symptoms
